# Patient Record
Sex: Female | Born: 1990 | Hispanic: No | Smoking: Current every day smoker
Health system: Southern US, Community
[De-identification: ages and names within clinical notes are randomized; demographics above are authoritative.]

## PROBLEM LIST (undated history)

## (undated) DIAGNOSIS — Z789 Other specified health status: Secondary | ICD-10-CM

## (undated) HISTORY — DX: Other specified health status: Z78.9

## (undated) HISTORY — PX: NO PAST SURGERIES: SHX2092

---

## 2015-11-08 LAB — OB RESULTS CONSOLE ABO/RH: RH TYPE: POSITIVE

## 2015-11-08 LAB — OB RESULTS CONSOLE HGB/HCT, BLOOD
HCT: 42 %
HEMOGLOBIN: 14.5 g/dL

## 2015-11-08 LAB — OB RESULTS CONSOLE ANTIBODY SCREEN: ANTIBODY SCREEN: NEGATIVE

## 2015-11-08 LAB — OB RESULTS CONSOLE RPR: RPR: NONREACTIVE

## 2015-11-08 LAB — OB RESULTS CONSOLE PLATELET COUNT: PLATELETS: 348 10*3/uL

## 2015-11-08 LAB — OB RESULTS CONSOLE RUBELLA ANTIBODY, IGM: Rubella: IMMUNE

## 2015-11-08 LAB — OB RESULTS CONSOLE HEPATITIS B SURFACE ANTIGEN: HEP B S AG: NEGATIVE

## 2015-11-08 LAB — OB RESULTS CONSOLE HIV ANTIBODY (ROUTINE TESTING): HIV: NONREACTIVE

## 2016-03-26 ENCOUNTER — Encounter: Payer: Self-pay | Admitting: *Deleted

## 2016-03-27 ENCOUNTER — Encounter: Payer: Self-pay | Admitting: *Deleted

## 2016-03-29 ENCOUNTER — Encounter: Payer: Self-pay | Admitting: Obstetrics and Gynecology

## 2016-03-29 ENCOUNTER — Ambulatory Visit (INDEPENDENT_AMBULATORY_CARE_PROVIDER_SITE_OTHER): Payer: Self-pay | Admitting: Obstetrics and Gynecology

## 2016-03-29 VITALS — BP 120/69 | HR 74 | Ht 69.0 in | Wt 146.6 lb

## 2016-03-29 DIAGNOSIS — Z720 Tobacco use: Secondary | ICD-10-CM | POA: Insufficient documentation

## 2016-03-29 DIAGNOSIS — Z3492 Encounter for supervision of normal pregnancy, unspecified, second trimester: Secondary | ICD-10-CM

## 2016-03-29 DIAGNOSIS — O0930 Supervision of pregnancy with insufficient antenatal care, unspecified trimester: Secondary | ICD-10-CM | POA: Insufficient documentation

## 2016-03-29 DIAGNOSIS — Z3482 Encounter for supervision of other normal pregnancy, second trimester: Secondary | ICD-10-CM

## 2016-03-29 DIAGNOSIS — Z23 Encounter for immunization: Secondary | ICD-10-CM

## 2016-03-29 DIAGNOSIS — O0993 Supervision of high risk pregnancy, unspecified, third trimester: Secondary | ICD-10-CM | POA: Insufficient documentation

## 2016-03-29 DIAGNOSIS — O0933 Supervision of pregnancy with insufficient antenatal care, third trimester: Secondary | ICD-10-CM

## 2016-03-29 LAB — CBC
HEMATOCRIT: 31.1 % — AB (ref 35.0–45.0)
HEMOGLOBIN: 10.8 g/dL — AB (ref 11.7–15.5)
MCH: 33 pg (ref 27.0–33.0)
MCHC: 34.7 g/dL (ref 32.0–36.0)
MCV: 95.1 fL (ref 80.0–100.0)
MPV: 9.6 fL (ref 7.5–12.5)
Platelets: 294 10*3/uL (ref 140–400)
RBC: 3.27 MIL/uL — ABNORMAL LOW (ref 3.80–5.10)
RDW: 12.7 % (ref 11.0–15.0)
WBC: 11.1 10*3/uL — AB (ref 3.8–10.8)

## 2016-03-29 LAB — POCT URINALYSIS DIP (DEVICE)
Bilirubin Urine: NEGATIVE
GLUCOSE, UA: NEGATIVE mg/dL
Hgb urine dipstick: NEGATIVE
KETONES UR: NEGATIVE mg/dL
Nitrite: NEGATIVE
PROTEIN: NEGATIVE mg/dL
SPECIFIC GRAVITY, URINE: 1.02 (ref 1.005–1.030)
Urobilinogen, UA: 1 mg/dL (ref 0.0–1.0)
pH: 7 (ref 5.0–8.0)

## 2016-03-29 MED ORDER — TETANUS-DIPHTH-ACELL PERTUSSIS 5-2.5-18.5 LF-MCG/0.5 IM SUSP
0.5000 mL | Freq: Once | INTRAMUSCULAR | Status: AC
  Administered 2016-03-29: 0.5 mL via INTRAMUSCULAR

## 2016-03-29 NOTE — Progress Notes (Addendum)
New OB Note  03/29/2016   Clinic: Center for [facility]  Chief Complaint: New OB  Transfer of Care Patient: Yes from [place]  History of Present Illness: Ms. Mannen is a 25 y.o. G3P1011 @ 27/3 weeks ([place] 10/30, based on Patient's last menstrual period was 09/19/2015 (exact date).), with the above CC. Preg complicated by has Supervision of normal pregnancy on her problem list. and tobacco abuse  She only had one visit with [facility] in early pregnancy but was dropped due to insurance issues.   Her periods were: regular, qmonth She was using no method when she conceived.  She has Negative signs or symptoms of miscarriage or preterm labor  ROS: A 12-point review of systems was performed and negative, except as stated in the above HPI.  OBGYN History: As per HPI. OB History  Gravida Para Term Preterm AB Living  3 1 1   1 1   SAB TAB Ectopic Multiple Live Births  1       1    # Outcome Date GA Lbr Len/2nd Weight Sex Delivery Anes PTL Lv  3 Current           2 SAB 10/26/14 [redacted]w[redacted]d         1 Term 09/23/09 [redacted]w[redacted]d  7 lb 11 oz (3.487 kg) M Vag-Spont EPI N LIV     Complications: Meconium aspiration      Any prior children are healthy, doing well, without any problems or issues: yes History of pap smears: No. Last pap smear 2016 and normal.  History of STIs: No   Past Medical History: Past Medical History:  Diagnosis Date  . Medical history non-contributory     Past Surgical History: Past Surgical History:  Procedure Laterality Date  . NO PAST SURGERIES      Family History:  Family History  Problem Relation Age of Onset  . Cancer Mother 47    breast   She denies any history of mental retardation, birth defects or genetic disorders in her or the FOB's history  Social History:  Social History   Social History  . Marital status: Unknown    Spouse name: N/A  . Number of children: N/A  . Years of education: N/A   Occupational History  . Not on file.   Social History  Main Topics  . Smoking status: Current Every Day Smoker    Packs/day: 0.50    Years: 7.00    Types: Cigarettes  . Smokeless tobacco: Never Used  . Alcohol use No  . Drug use: No  . Sexual activity: Yes    Birth control/ protection: None   Other Topics Concern  . Not on file   Social History Narrative  . 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 Fundal height: 24 FHTs: 130s-140s  General appearance: Well nourished, well developed female in no acute distress.  Neck:  Supple, normal appearance, and no thyromegaly  Cardiovascular: S1, S2 normal, no murmur, rub or gallop, regular rate and rhythm Respiratory:  Clear to auscultation bilateral. Normal respiratory effort Abdomen: positive bowel sounds and no masses,  hernias; diffusely non tender to palpation, non distended Breasts: not examined. Neuro/Psych:  Normal mood and affect.  Skin:  Warm and dry.   Pelvic exam: deferred  Laboratory: O pos/RI/VDRL neg/hiv neg/hepB neg/ucx neg/gc-ct and cbc neg  Imaging:  Patient states she has not had any imaging this pregnancy and their is none in the scanned records  Assessment: Pt stable  Plan: 1. Supervision of normal pregnancy, antepartum, second trimester Routine care. tdap today. D/w pt that [place] is on the small side which could be due to dates being off but [place] precautions given and ASAP anatomy/growth/dating u/s scheduled. Tobacco cessation encourage (pt down to 3 per day) and risks associated with use d/w pt.  - HIV antibody (with reflex) - RPR - CBC - Glucose Tolerance, 1 HR (50g) - [place] MFM OB COMP + 14 WK; Future  Problem list reviewed and  updated.  Follow up in 2 weeks.  >50% of 20 min visit spent on counseling and coordination of care.     Durene Romans MD Attending Center for [facility])

## 2016-03-29 NOTE — Progress Notes (Signed)
1hr gtt/28week labs

## 2016-03-30 LAB — HIV ANTIBODY (ROUTINE TESTING W REFLEX): HIV 1&2 Ab, 4th Generation: NONREACTIVE

## 2016-03-30 LAB — RPR

## 2016-03-30 LAB — GLUCOSE TOLERANCE, 1 HOUR (50G) W/O FASTING: Glucose, 1 Hr, gestational: 95 mg/dL (ref <140)

## 2016-04-06 ENCOUNTER — Other Ambulatory Visit: Payer: Self-pay | Admitting: Obstetrics and Gynecology

## 2016-04-06 ENCOUNTER — Ambulatory Visit (HOSPITAL_COMMUNITY)
Admission: RE | Admit: 2016-04-06 | Discharge: 2016-04-06 | Disposition: A | Discharge status: Home / Self Care | Payer: Self-pay | Source: Ambulatory Visit | Attending: Obstetrics and Gynecology | Admitting: Obstetrics and Gynecology

## 2016-04-06 DIAGNOSIS — O99333 Smoking (tobacco) complicating pregnancy, third trimester: Secondary | ICD-10-CM | POA: Insufficient documentation

## 2016-04-06 DIAGNOSIS — Z3482 Encounter for supervision of other normal pregnancy, second trimester: Secondary | ICD-10-CM

## 2016-04-06 DIAGNOSIS — O0933 Supervision of pregnancy with insufficient antenatal care, third trimester: Secondary | ICD-10-CM | POA: Insufficient documentation

## 2016-04-06 DIAGNOSIS — Z36 Encounter for antenatal screening of mother: Secondary | ICD-10-CM | POA: Insufficient documentation

## 2016-04-06 DIAGNOSIS — Z3A28 28 weeks gestation of pregnancy: Secondary | ICD-10-CM | POA: Insufficient documentation

## 2016-04-09 ENCOUNTER — Other Ambulatory Visit (HOSPITAL_COMMUNITY): Payer: Self-pay | Admitting: *Deleted

## 2016-04-09 ENCOUNTER — Encounter: Payer: Self-pay | Admitting: Family Medicine

## 2016-04-09 DIAGNOSIS — O358XX0 Maternal care for other (suspected) fetal abnormality and damage, not applicable or unspecified: Secondary | ICD-10-CM | POA: Insufficient documentation

## 2016-04-10 ENCOUNTER — Encounter (HOSPITAL_COMMUNITY): Payer: Self-pay

## 2016-04-10 ENCOUNTER — Ambulatory Visit (HOSPITAL_COMMUNITY)
Admission: RE | Admit: 2016-04-10 | Discharge: 2016-04-10 | Disposition: A | Discharge status: Home / Self Care | Payer: Self-pay | Source: Ambulatory Visit | Attending: Obstetrics and Gynecology | Admitting: Obstetrics and Gynecology

## 2016-04-10 DIAGNOSIS — O2200 Varicose veins of lower extremity in pregnancy, unspecified trimester: Secondary | ICD-10-CM | POA: Insufficient documentation

## 2016-04-10 DIAGNOSIS — O358XX0 Maternal care for other (suspected) fetal abnormality and damage, not applicable or unspecified: Secondary | ICD-10-CM

## 2016-04-13 ENCOUNTER — Encounter (HOSPITAL_COMMUNITY): Payer: Self-pay

## 2016-04-13 ENCOUNTER — Ambulatory Visit (HOSPITAL_COMMUNITY)
Admission: RE | Admit: 2016-04-13 | Discharge: 2016-04-13 | Disposition: A | Discharge status: Home / Self Care | Payer: Self-pay | Source: Ambulatory Visit | Attending: Obstetrics and Gynecology | Admitting: Obstetrics and Gynecology

## 2016-04-13 DIAGNOSIS — O358XX0 Maternal care for other (suspected) fetal abnormality and damage, not applicable or unspecified: Secondary | ICD-10-CM

## 2016-04-13 DIAGNOSIS — Z3A00 Weeks of gestation of pregnancy not specified: Secondary | ICD-10-CM | POA: Insufficient documentation

## 2016-04-17 ENCOUNTER — Ambulatory Visit (HOSPITAL_COMMUNITY): Payer: Self-pay

## 2016-04-18 ENCOUNTER — Ambulatory Visit (HOSPITAL_COMMUNITY)
Admission: RE | Admit: 2016-04-18 | Payer: Self-pay | Source: Ambulatory Visit | Attending: Maternal and Fetal Medicine | Admitting: Maternal and Fetal Medicine

## 2016-04-20 ENCOUNTER — Ambulatory Visit (HOSPITAL_COMMUNITY)
Admission: RE | Admit: 2016-04-20 | Discharge: 2016-04-20 | Disposition: A | Discharge status: Home / Self Care | Payer: Self-pay | Source: Ambulatory Visit | Attending: Obstetrics and Gynecology | Admitting: Obstetrics and Gynecology

## 2016-04-20 ENCOUNTER — Encounter (HOSPITAL_COMMUNITY): Payer: Self-pay

## 2016-04-20 DIAGNOSIS — O0933 Supervision of pregnancy with insufficient antenatal care, third trimester: Secondary | ICD-10-CM | POA: Insufficient documentation

## 2016-04-20 DIAGNOSIS — Z3A30 30 weeks gestation of pregnancy: Secondary | ICD-10-CM | POA: Insufficient documentation

## 2016-04-20 DIAGNOSIS — O358XX0 Maternal care for other (suspected) fetal abnormality and damage, not applicable or unspecified: Secondary | ICD-10-CM

## 2016-04-20 DIAGNOSIS — O99333 Smoking (tobacco) complicating pregnancy, third trimester: Secondary | ICD-10-CM | POA: Insufficient documentation

## 2016-04-24 ENCOUNTER — Ambulatory Visit (HOSPITAL_COMMUNITY)
Admission: RE | Admit: 2016-04-24 | Discharge: 2016-04-24 | Disposition: A | Discharge status: Home / Self Care | Payer: Self-pay | Source: Ambulatory Visit | Attending: Obstetrics and Gynecology | Admitting: Obstetrics and Gynecology

## 2016-04-24 ENCOUNTER — Encounter (HOSPITAL_COMMUNITY): Payer: Self-pay

## 2016-04-24 DIAGNOSIS — O358XX0 Maternal care for other (suspected) fetal abnormality and damage, not applicable or unspecified: Secondary | ICD-10-CM

## 2016-04-24 DIAGNOSIS — O99333 Smoking (tobacco) complicating pregnancy, third trimester: Secondary | ICD-10-CM | POA: Insufficient documentation

## 2016-04-24 DIAGNOSIS — Z3A31 31 weeks gestation of pregnancy: Secondary | ICD-10-CM | POA: Insufficient documentation

## 2016-04-24 DIAGNOSIS — O0933 Supervision of pregnancy with insufficient antenatal care, third trimester: Secondary | ICD-10-CM | POA: Insufficient documentation

## 2016-04-26 ENCOUNTER — Ambulatory Visit (INDEPENDENT_AMBULATORY_CARE_PROVIDER_SITE_OTHER): Payer: Self-pay | Admitting: Obstetrics and Gynecology

## 2016-04-26 VITALS — BP 106/72 | HR 65 | Wt 146.8 lb

## 2016-04-26 DIAGNOSIS — O0993 Supervision of high risk pregnancy, unspecified, third trimester: Secondary | ICD-10-CM

## 2016-04-26 DIAGNOSIS — O358XX0 Maternal care for other (suspected) fetal abnormality and damage, not applicable or unspecified: Secondary | ICD-10-CM

## 2016-04-26 LAB — POCT URINALYSIS DIP (DEVICE)
BILIRUBIN URINE: NEGATIVE
GLUCOSE, UA: NEGATIVE mg/dL
Hgb urine dipstick: NEGATIVE
KETONES UR: NEGATIVE mg/dL
LEUKOCYTES UA: NEGATIVE
NITRITE: NEGATIVE
PH: 7 (ref 5.0–8.0)
Protein, ur: NEGATIVE mg/dL
Specific Gravity, Urine: 1.02 (ref 1.005–1.030)
Urobilinogen, UA: 0.2 mg/dL (ref 0.0–1.0)

## 2016-04-26 NOTE — Progress Notes (Signed)
Prenatal Visit Note Date: 04/26/2016 Clinic: Center for [facility]  Subjective:  Regina Barnett is a 25 y.o. G3P1011 at [redacted]w[redacted]d being seen today for ongoing prenatal care.  She is currently monitored for the following issues for this high-risk pregnancy and has Supervision of high risk pregnancy in third trimester; Late prenatal care; Tobacco abuse; and Umbilical vein abnormality affecting pregnancy on her problem list.  Patient reports no complaints.    .  .  Movement: Present. Denies leaking of fluid.   The following portions of the patient's history were reviewed and updated as appropriate: allergies, current medications, past family history, past medical history, past social history, past surgical history and problem list. Problem list updated.  Objective:   Vitals:   04/26/16 1113  BP: 106/72  Pulse: 65  Weight: 146 lb 12.8 oz (66.6 kg)    Fetal Status: Fetal Heart Rate (bpm): 154   Movement: Present     General:  Alert, oriented and cooperative. Patient is in no acute distress.  Skin: Skin is warm and dry. No rash noted.   Cardiovascular: Normal heart rate noted  Respiratory: Normal respiratory effort, no problems with respiration noted  Abdomen: Soft, gravid, appropriate for gestational age. Pain/Pressure: Absent     Pelvic:  Cervical exam deferred        Extremities: Normal range of motion.     Mental Status: Normal mood and affect. Normal behavior. Normal judgment and thought content.   Urinalysis:      Assessment and Plan:  Pregnancy: G3P1011 at [redacted]w[redacted]d  1. Supervision of high risk pregnancy in third trimester Routine care. Told to take extra qday iron given slight anemia. Work note given to limit work week to [employer]  2. Umbilical vein abnormality affecting pregnancy Continue with mfm 2x/week bpp. Next one tomorrow. Delivery at 37wks  Preterm labor symptoms and general obstetric precautions including but not limited to vaginal bleeding, contractions, leaking  of fluid and fetal movement were reviewed in detail with the patient. Please refer to After Visit Summary for other counseling recommendations.  Return in about 2 weeks (around 05/10/2016).   Aletha Halim, MD

## 2016-04-27 ENCOUNTER — Ambulatory Visit (HOSPITAL_COMMUNITY)
Admission: RE | Admit: 2016-04-27 | Discharge: 2016-04-27 | Disposition: A | Discharge status: Home / Self Care | Payer: Self-pay | Source: Ambulatory Visit | Attending: Obstetrics and Gynecology | Admitting: Obstetrics and Gynecology

## 2016-04-27 ENCOUNTER — Encounter (HOSPITAL_COMMUNITY): Payer: Self-pay

## 2016-04-27 DIAGNOSIS — O0933 Supervision of pregnancy with insufficient antenatal care, third trimester: Secondary | ICD-10-CM | POA: Insufficient documentation

## 2016-04-27 DIAGNOSIS — O99333 Smoking (tobacco) complicating pregnancy, third trimester: Secondary | ICD-10-CM | POA: Insufficient documentation

## 2016-04-27 DIAGNOSIS — O358XX0 Maternal care for other (suspected) fetal abnormality and damage, not applicable or unspecified: Secondary | ICD-10-CM

## 2016-04-27 DIAGNOSIS — Z3A31 31 weeks gestation of pregnancy: Secondary | ICD-10-CM | POA: Insufficient documentation

## 2016-05-01 ENCOUNTER — Ambulatory Visit (HOSPITAL_COMMUNITY): Admission: RE | Admit: 2016-05-01 | Payer: Self-pay | Source: Ambulatory Visit

## 2016-05-02 ENCOUNTER — Encounter (HOSPITAL_COMMUNITY): Payer: Self-pay

## 2016-05-02 ENCOUNTER — Ambulatory Visit (HOSPITAL_COMMUNITY)
Admission: RE | Admit: 2016-05-02 | Discharge: 2016-05-02 | Disposition: A | Discharge status: Home / Self Care | Payer: Medicaid Other | Source: Ambulatory Visit | Attending: Obstetrics and Gynecology | Admitting: Obstetrics and Gynecology

## 2016-05-02 DIAGNOSIS — Z3A32 32 weeks gestation of pregnancy: Secondary | ICD-10-CM | POA: Diagnosis not present

## 2016-05-02 DIAGNOSIS — Z36 Encounter for antenatal screening of mother: Secondary | ICD-10-CM | POA: Insufficient documentation

## 2016-05-02 DIAGNOSIS — O358XX0 Maternal care for other (suspected) fetal abnormality and damage, not applicable or unspecified: Secondary | ICD-10-CM

## 2016-05-04 ENCOUNTER — Encounter (HOSPITAL_COMMUNITY): Payer: Self-pay

## 2016-05-04 ENCOUNTER — Ambulatory Visit (HOSPITAL_COMMUNITY)
Admission: RE | Admit: 2016-05-04 | Discharge: 2016-05-04 | Disposition: A | Discharge status: Home / Self Care | Payer: Medicaid Other | Source: Ambulatory Visit | Attending: Obstetrics and Gynecology | Admitting: Obstetrics and Gynecology

## 2016-05-04 DIAGNOSIS — O99333 Smoking (tobacco) complicating pregnancy, third trimester: Secondary | ICD-10-CM | POA: Insufficient documentation

## 2016-05-04 DIAGNOSIS — O0933 Supervision of pregnancy with insufficient antenatal care, third trimester: Secondary | ICD-10-CM | POA: Insufficient documentation

## 2016-05-04 DIAGNOSIS — Z3A32 32 weeks gestation of pregnancy: Secondary | ICD-10-CM | POA: Insufficient documentation

## 2016-05-04 DIAGNOSIS — O358XX0 Maternal care for other (suspected) fetal abnormality and damage, not applicable or unspecified: Secondary | ICD-10-CM

## 2016-05-07 ENCOUNTER — Other Ambulatory Visit (HOSPITAL_COMMUNITY): Payer: Self-pay | Admitting: *Deleted

## 2016-05-07 DIAGNOSIS — O283 Abnormal ultrasonic finding on antenatal screening of mother: Secondary | ICD-10-CM

## 2016-05-08 ENCOUNTER — Ambulatory Visit (HOSPITAL_COMMUNITY)
Admission: RE | Admit: 2016-05-08 | Discharge: 2016-05-08 | Disposition: A | Discharge status: Home / Self Care | Payer: Self-pay | Source: Ambulatory Visit | Attending: Obstetrics and Gynecology | Admitting: Obstetrics and Gynecology

## 2016-05-08 ENCOUNTER — Encounter (HOSPITAL_COMMUNITY): Payer: Self-pay

## 2016-05-10 ENCOUNTER — Ambulatory Visit (INDEPENDENT_AMBULATORY_CARE_PROVIDER_SITE_OTHER): Payer: Self-pay | Admitting: Obstetrics and Gynecology

## 2016-05-10 ENCOUNTER — Encounter: Payer: Self-pay | Admitting: Advanced Practice Midwife

## 2016-05-10 VITALS — BP 117/73 | HR 106 | Wt 146.6 lb

## 2016-05-10 DIAGNOSIS — O0993 Supervision of high risk pregnancy, unspecified, third trimester: Secondary | ICD-10-CM

## 2016-05-10 DIAGNOSIS — O358XX0 Maternal care for other (suspected) fetal abnormality and damage, not applicable or unspecified: Secondary | ICD-10-CM

## 2016-05-10 LAB — POCT URINALYSIS DIP (DEVICE)
GLUCOSE, UA: NEGATIVE mg/dL
HGB URINE DIPSTICK: NEGATIVE
KETONES UR: NEGATIVE mg/dL
Nitrite: NEGATIVE
Protein, ur: 30 mg/dL — AB
SPECIFIC GRAVITY, URINE: 1.025 (ref 1.005–1.030)
Urobilinogen, UA: 4 mg/dL — ABNORMAL HIGH (ref 0.0–1.0)
pH: 6.5 (ref 5.0–8.0)

## 2016-05-10 NOTE — Progress Notes (Signed)
Subjective:  Regina Barnett is a 25 y.o. G3P1011 at [redacted]w[redacted]d being seen today for ongoing prenatal care.  She is currently monitored for the following issues for this high-risk pregnancy and has Supervision of high risk pregnancy in third trimester; Late prenatal care; Tobacco abuse; and Umbilical vein abnormality affecting pregnancy on her problem list.  Patient reports no complaints.  Contractions: Irregular. Vag. Bleeding: None.  Movement: Present. Denies leaking of fluid.   The following portions of the patient's history were reviewed and updated as appropriate: allergies, current medications, past family history, past medical history, past social history, past surgical history and problem list. Problem list updated.  Objective:   Vitals:   05/10/16 1425  BP: 117/73  Pulse: (!) 106  Weight: 146 lb 9.6 oz (66.5 kg)    Fetal Status: Fetal Heart Rate (bpm): 152   Movement: Present     General:  Alert, oriented and cooperative. Patient is in no acute distress.  Skin: Skin is warm and dry. No rash noted.   Cardiovascular: Normal heart rate noted  Respiratory: Normal respiratory effort, no problems with respiration noted  Abdomen: Soft, gravid, appropriate for gestational age. Pain/Pressure: Absent     Pelvic:  Cervical exam deferred        Extremities: Normal range of motion.  Edema: None  Mental Status: Normal mood and affect. Normal behavior. Normal judgment and thought content.   Urinalysis: Urine Protein: 1+ Urine Glucose: Negative  Assessment and Plan:  Pregnancy: G3P1011 at [redacted]w[redacted]d  1. Supervision of high risk pregnancy in third trimester 2. Umbilical vein abnormality  Continue with antenatal testing as per MFM and delivery at 37 weeks  Preterm labor symptoms and general obstetric precautions including but not limited to vaginal bleeding, contractions, leaking of fluid and fetal movement were reviewed in detail with the patient. Please refer to After Visit Summary for other  counseling recommendations.  No Follow-up on file.   Chancy Milroy, MD

## 2016-05-10 NOTE — Progress Notes (Signed)
Declines flu shot

## 2016-05-11 ENCOUNTER — Ambulatory Visit (HOSPITAL_COMMUNITY)
Admission: RE | Admit: 2016-05-11 | Discharge: 2016-05-11 | Disposition: A | Discharge status: Home / Self Care | Payer: Medicaid Other | Source: Ambulatory Visit | Attending: Obstetrics and Gynecology | Admitting: Obstetrics and Gynecology

## 2016-05-11 ENCOUNTER — Encounter (HOSPITAL_COMMUNITY): Payer: Self-pay

## 2016-05-11 DIAGNOSIS — O99333 Smoking (tobacco) complicating pregnancy, third trimester: Secondary | ICD-10-CM | POA: Diagnosis not present

## 2016-05-11 DIAGNOSIS — O358XX0 Maternal care for other (suspected) fetal abnormality and damage, not applicable or unspecified: Secondary | ICD-10-CM

## 2016-05-11 DIAGNOSIS — O0933 Supervision of pregnancy with insufficient antenatal care, third trimester: Secondary | ICD-10-CM | POA: Diagnosis not present

## 2016-05-11 DIAGNOSIS — Z3A33 33 weeks gestation of pregnancy: Secondary | ICD-10-CM | POA: Insufficient documentation

## 2016-05-11 DIAGNOSIS — O283 Abnormal ultrasonic finding on antenatal screening of mother: Secondary | ICD-10-CM

## 2016-05-15 ENCOUNTER — Encounter (HOSPITAL_COMMUNITY): Payer: Self-pay

## 2016-05-15 ENCOUNTER — Ambulatory Visit (HOSPITAL_COMMUNITY)
Admission: RE | Admit: 2016-05-15 | Discharge: 2016-05-15 | Disposition: A | Discharge status: Home / Self Care | Payer: Medicaid Other | Source: Ambulatory Visit | Attending: Obstetrics and Gynecology | Admitting: Obstetrics and Gynecology

## 2016-05-15 DIAGNOSIS — Z3A34 34 weeks gestation of pregnancy: Secondary | ICD-10-CM | POA: Insufficient documentation

## 2016-05-15 DIAGNOSIS — O0933 Supervision of pregnancy with insufficient antenatal care, third trimester: Secondary | ICD-10-CM | POA: Insufficient documentation

## 2016-05-15 DIAGNOSIS — O358XX0 Maternal care for other (suspected) fetal abnormality and damage, not applicable or unspecified: Secondary | ICD-10-CM

## 2016-05-15 DIAGNOSIS — O99333 Smoking (tobacco) complicating pregnancy, third trimester: Secondary | ICD-10-CM | POA: Insufficient documentation

## 2016-05-15 DIAGNOSIS — O283 Abnormal ultrasonic finding on antenatal screening of mother: Secondary | ICD-10-CM

## 2016-05-18 ENCOUNTER — Ambulatory Visit (HOSPITAL_COMMUNITY): Admission: RE | Admit: 2016-05-18 | Payer: Self-pay | Source: Ambulatory Visit

## 2016-05-22 ENCOUNTER — Ambulatory Visit (HOSPITAL_COMMUNITY): Admission: RE | Admit: 2016-05-22 | Payer: Self-pay | Source: Ambulatory Visit

## 2016-05-25 ENCOUNTER — Ambulatory Visit (HOSPITAL_COMMUNITY): Admission: RE | Admit: 2016-05-25 | Payer: Self-pay | Source: Ambulatory Visit

## 2016-05-25 ENCOUNTER — Ambulatory Visit (INDEPENDENT_AMBULATORY_CARE_PROVIDER_SITE_OTHER): Payer: Self-pay | Admitting: Obstetrics and Gynecology

## 2016-05-25 VITALS — BP 120/63 | HR 104 | Wt 145.8 lb

## 2016-05-25 DIAGNOSIS — O0993 Supervision of high risk pregnancy, unspecified, third trimester: Secondary | ICD-10-CM

## 2016-05-25 DIAGNOSIS — O358XX0 Maternal care for other (suspected) fetal abnormality and damage, not applicable or unspecified: Secondary | ICD-10-CM

## 2016-05-25 DIAGNOSIS — O0933 Supervision of pregnancy with insufficient antenatal care, third trimester: Secondary | ICD-10-CM

## 2016-05-25 LAB — OB RESULTS CONSOLE GC/CHLAMYDIA: GC PROBE AMP, GENITAL: NEGATIVE

## 2016-05-25 LAB — OB RESULTS CONSOLE GBS: STREP GROUP B AG: POSITIVE

## 2016-05-25 NOTE — Progress Notes (Signed)
   PRENATAL VISIT NOTE  Subjective:  Regina Barnett is a 25 y.o. G3P1011 at [redacted]w[redacted]d being seen today for ongoing prenatal care.  She is currently monitored for the following issues for this high-risk pregnancy and has Supervision of high risk pregnancy in third trimester; Late prenatal care; Tobacco abuse; and Umbilical vein abnormality affecting pregnancy on her problem list.  Patient reports no complaints.  Contractions: Not present. Vag. Bleeding: None.  Movement: Present. Denies leaking of fluid.   The following portions of the patient's history were reviewed and updated as appropriate: allergies, current medications, past family history, past medical history, past social history, past surgical history and problem list. Problem list updated.  Objective:   Vitals:   05/25/16 1100  BP: 120/63  Pulse: (!) 104  Weight: 145 lb 12.8 oz (66.1 kg)    Fetal Status: Fetal Heart Rate (bpm): 135 Fundal Height: 35 cm Movement: Present     General:  Alert, oriented and cooperative. Patient is in no acute distress.  Skin: Skin is warm and dry. No rash noted.   Cardiovascular: Normal heart rate noted  Respiratory: Normal respiratory effort, no problems with respiration noted  Abdomen: Soft, gravid, appropriate for gestational age. Pain/Pressure: Absent     Pelvic:  Cervical exam performed Dilation: 1.5 Effacement (%): 50 Station: Ballotable  Extremities: Normal range of motion.  Edema: None  Mental Status: Normal mood and affect. Normal behavior. Normal judgment and thought content.   Urinalysis:      Assessment and Plan:  Pregnancy: G3P1011 at [redacted]w[redacted]d  1. Supervision of high risk pregnancy in third trimester Patient is doing well without complaints Cultures today Patient is interested in BTL - Culture, beta strep (group b only) - GC/Chlamydia probe amp ([place])not at [facility]  2. Late prenatal care, third trimester   3. Umbilical vein abnormality affecting pregnancy Follow up BPP  today Scheduling IOL at 37 weeks   Preterm labor symptoms and general obstetric precautions including but not limited to vaginal bleeding, contractions, leaking of fluid and fetal movement were reviewed in detail with the patient. Please refer to After Visit Summary for other counseling recommendations.  Return in about 1 week (around 06/01/2016).  Mora Bellman, MD

## 2016-05-26 LAB — CULTURE, BETA STREP (GROUP B ONLY)

## 2016-05-28 LAB — GC/CHLAMYDIA PROBE AMP (~~LOC~~) NOT AT ARMC
CHLAMYDIA, DNA PROBE: NEGATIVE
Neisseria Gonorrhea: NEGATIVE

## 2016-05-29 ENCOUNTER — Telehealth (HOSPITAL_COMMUNITY): Payer: Self-pay | Admitting: *Deleted

## 2016-05-29 ENCOUNTER — Encounter (HOSPITAL_COMMUNITY): Payer: Self-pay

## 2016-05-29 ENCOUNTER — Other Ambulatory Visit (HOSPITAL_COMMUNITY): Payer: Self-pay | Admitting: Maternal and Fetal Medicine

## 2016-05-29 ENCOUNTER — Ambulatory Visit (HOSPITAL_COMMUNITY)
Admission: RE | Admit: 2016-05-29 | Discharge: 2016-05-29 | Disposition: A | Discharge status: Home / Self Care | Payer: Medicaid Other | Source: Ambulatory Visit | Attending: Obstetrics and Gynecology | Admitting: Obstetrics and Gynecology

## 2016-05-29 DIAGNOSIS — O0933 Supervision of pregnancy with insufficient antenatal care, third trimester: Secondary | ICD-10-CM | POA: Insufficient documentation

## 2016-05-29 DIAGNOSIS — O99333 Smoking (tobacco) complicating pregnancy, third trimester: Secondary | ICD-10-CM

## 2016-05-29 DIAGNOSIS — Z3A36 36 weeks gestation of pregnancy: Secondary | ICD-10-CM | POA: Diagnosis not present

## 2016-05-29 DIAGNOSIS — O283 Abnormal ultrasonic finding on antenatal screening of mother: Secondary | ICD-10-CM

## 2016-05-29 NOTE — Telephone Encounter (Signed)
Preadmission screen  

## 2016-06-01 ENCOUNTER — Ambulatory Visit (HOSPITAL_COMMUNITY)
Admission: RE | Admit: 2016-06-01 | Discharge: 2016-06-01 | Disposition: A | Discharge status: Home / Self Care | Payer: Medicaid Other | Source: Ambulatory Visit | Attending: Obstetrics and Gynecology | Admitting: Obstetrics and Gynecology

## 2016-06-01 ENCOUNTER — Ambulatory Visit (INDEPENDENT_AMBULATORY_CARE_PROVIDER_SITE_OTHER): Payer: Self-pay | Admitting: Obstetrics & Gynecology

## 2016-06-01 ENCOUNTER — Encounter (HOSPITAL_COMMUNITY): Payer: Self-pay

## 2016-06-01 ENCOUNTER — Other Ambulatory Visit (HOSPITAL_COMMUNITY): Payer: Self-pay | Admitting: Maternal and Fetal Medicine

## 2016-06-01 VITALS — BP 121/80 | HR 100 | Wt 146.3 lb

## 2016-06-01 DIAGNOSIS — O99333 Smoking (tobacco) complicating pregnancy, third trimester: Secondary | ICD-10-CM | POA: Diagnosis not present

## 2016-06-01 DIAGNOSIS — O321XX0 Maternal care for breech presentation, not applicable or unspecified: Secondary | ICD-10-CM

## 2016-06-01 DIAGNOSIS — Z3A36 36 weeks gestation of pregnancy: Secondary | ICD-10-CM | POA: Insufficient documentation

## 2016-06-01 DIAGNOSIS — O358XX0 Maternal care for other (suspected) fetal abnormality and damage, not applicable or unspecified: Secondary | ICD-10-CM

## 2016-06-01 DIAGNOSIS — O0993 Supervision of high risk pregnancy, unspecified, third trimester: Secondary | ICD-10-CM

## 2016-06-01 DIAGNOSIS — O0933 Supervision of pregnancy with insufficient antenatal care, third trimester: Secondary | ICD-10-CM | POA: Insufficient documentation

## 2016-06-01 DIAGNOSIS — O283 Abnormal ultrasonic finding on antenatal screening of mother: Secondary | ICD-10-CM

## 2016-06-01 NOTE — Progress Notes (Signed)
   PRENATAL VISIT NOTE  Subjective:  Regina Barnett is a 25 y.o. G3P1011 at [redacted]w[redacted]d being seen today for ongoing prenatal care.  She is currently monitored for the following issues for this high-risk pregnancy and has Supervision of high risk pregnancy in third trimester; Late prenatal care; Tobacco abuse; Umbilical vein abnormality affecting pregnancy; and Breech presentation on her problem list.  Patient reports no complaints.  Contractions: Not present. Vag. Bleeding: None.  Movement: Present. Denies leaking of fluid.   The following portions of the patient's history were reviewed and updated as appropriate: allergies, current medications, past family history, past medical history, past social history, past surgical history and problem list. Problem list updated.  Objective:   Vitals:   06/01/16 0938  BP: 121/80  Pulse: 100  Weight: 146 lb 4.8 oz (66.4 kg)    Fetal Status: Fetal Heart Rate (bpm): 140   Movement: Present     General:  Alert, oriented and cooperative. Patient is in no acute distress.  Skin: Skin is warm and dry. No rash noted.   Cardiovascular: Normal heart rate noted  Respiratory: Normal respiratory effort, no problems with respiration noted  Abdomen: Soft, gravid, appropriate for gestational age. Pain/Pressure: Absent     Pelvic:  Cervical exam deferred        Extremities: Normal range of motion.  Edema: None  Mental Status: Normal mood and affect. Normal behavior. Normal judgment and thought content.   Urinalysis:      Assessment and Plan:  Pregnancy: G3P1011 at [redacted]w[redacted]d, breech  By [place]  1. Umbilical vein abnormality affecting pregnancy, single or unspecified fetus IOL 37 weeks is scheduled  2. Supervision of high risk pregnancy in third trimester [place] scheduled today for growth  3. Breech presentation, single or unspecified fetus If breech in 3 days is a candidate for ECV  Preterm labor symptoms and general obstetric precautions including but not  limited to vaginal bleeding, contractions, leaking of fluid and fetal movement were reviewed in detail with the patient. Please refer to After Visit Summary for other counseling recommendations.  Return if symptoms worsen or fail to improve, for 6 weeks PP.  Woodroe Mode, MD

## 2016-06-01 NOTE — Progress Notes (Signed)
Pt scheduled for induction on Monday. Concerned that baby is still breach.

## 2016-06-04 ENCOUNTER — Encounter (HOSPITAL_COMMUNITY)
Admission: RE | Disposition: A | Discharge status: Home / Self Care | Payer: Self-pay | Source: Ambulatory Visit | Attending: Obstetrics and Gynecology

## 2016-06-04 ENCOUNTER — Encounter (HOSPITAL_COMMUNITY): Payer: Self-pay

## 2016-06-04 ENCOUNTER — Inpatient Hospital Stay (HOSPITAL_COMMUNITY)
Admission: RE | Admit: 2016-06-04 | Discharge: 2016-06-06 | DRG: 766 | Disposition: A | Discharge status: Home / Self Care | Payer: Medicaid Other | Source: Ambulatory Visit | Attending: Obstetrics and Gynecology | Admitting: Obstetrics and Gynecology

## 2016-06-04 ENCOUNTER — Inpatient Hospital Stay (HOSPITAL_COMMUNITY): Payer: Medicaid Other | Admitting: Anesthesiology

## 2016-06-04 DIAGNOSIS — O358XX0 Maternal care for other (suspected) fetal abnormality and damage, not applicable or unspecified: Secondary | ICD-10-CM

## 2016-06-04 DIAGNOSIS — O99334 Smoking (tobacco) complicating childbirth: Secondary | ICD-10-CM | POA: Diagnosis present

## 2016-06-04 DIAGNOSIS — O321XX0 Maternal care for breech presentation, not applicable or unspecified: Secondary | ICD-10-CM | POA: Diagnosis not present

## 2016-06-04 DIAGNOSIS — Z3A37 37 weeks gestation of pregnancy: Secondary | ICD-10-CM

## 2016-06-04 DIAGNOSIS — F17210 Nicotine dependence, cigarettes, uncomplicated: Secondary | ICD-10-CM | POA: Diagnosis not present

## 2016-06-04 DIAGNOSIS — O99824 Streptococcus B carrier state complicating childbirth: Secondary | ICD-10-CM | POA: Diagnosis not present

## 2016-06-04 LAB — CBC
HEMATOCRIT: 36.5 % (ref 36.0–46.0)
HEMOGLOBIN: 13 g/dL (ref 12.0–15.0)
MCH: 33.5 pg (ref 26.0–34.0)
MCHC: 35.6 g/dL (ref 30.0–36.0)
MCV: 94.1 fL (ref 78.0–100.0)
Platelets: 304 10*3/uL (ref 150–400)
RBC: 3.88 MIL/uL (ref 3.87–5.11)
RDW: 12.6 % (ref 11.5–15.5)
WBC: 11.8 10*3/uL — AB (ref 4.0–10.5)

## 2016-06-04 LAB — TYPE AND SCREEN
ABO/RH(D): O POS
Antibody Screen: NEGATIVE

## 2016-06-04 LAB — ABO/RH: ABO/RH(D): O POS

## 2016-06-04 LAB — RPR: RPR Ser Ql: NONREACTIVE

## 2016-06-04 SURGERY — Surgical Case
Anesthesia: Spinal | Site: Abdomen | Wound class: Clean Contaminated

## 2016-06-04 MED ORDER — MENTHOL 3 MG MT LOZG: 1.0000 | LOZENGE | OROMUCOSAL | Status: DC | PRN

## 2016-06-04 MED ORDER — BUPIVACAINE IN DEXTROSE 0.75-8.25 % IT SOLN
INTRATHECAL | Status: DC | PRN
  Administered 2016-06-04: 1.6 mg via INTRATHECAL

## 2016-06-04 MED ORDER — MORPHINE SULFATE-NACL 0.5-0.9 MG/ML-% IV SOSY
PREFILLED_SYRINGE | INTRAVENOUS | Status: AC
  Filled 2016-06-04: qty 1

## 2016-06-04 MED ORDER — NALOXONE HCL 0.4 MG/ML IJ SOLN: 0.4000 mg | INTRAMUSCULAR | Status: DC | PRN

## 2016-06-04 MED ORDER — OXYCODONE HCL 5 MG PO TABS
5.0000 mg | ORAL_TABLET | ORAL | Status: DC | PRN
  Administered 2016-06-05 (×2): 5 mg via ORAL
  Filled 2016-06-04 (×2): qty 1

## 2016-06-04 MED ORDER — TETANUS-DIPHTH-ACELL PERTUSSIS 5-2.5-18.5 LF-MCG/0.5 IM SUSP: 0.5000 mL | Freq: Once | INTRAMUSCULAR | Status: DC

## 2016-06-04 MED ORDER — NALBUPHINE HCL 10 MG/ML IJ SOLN: 5.0000 mg | INTRAMUSCULAR | Status: DC | PRN

## 2016-06-04 MED ORDER — ACETAMINOPHEN 325 MG PO TABS: 650.0000 mg | ORAL_TABLET | ORAL | Status: DC | PRN

## 2016-06-04 MED ORDER — OXYTOCIN 10 UNIT/ML IJ SOLN
INTRAMUSCULAR | Status: AC
  Filled 2016-06-04: qty 4

## 2016-06-04 MED ORDER — WITCH HAZEL-GLYCERIN EX PADS: 1.0000 "application " | MEDICATED_PAD | CUTANEOUS | Status: DC | PRN

## 2016-06-04 MED ORDER — LACTATED RINGERS IV SOLN
INTRAVENOUS | Status: DC
  Administered 2016-06-04 (×3): via INTRAVENOUS

## 2016-06-04 MED ORDER — SOD CITRATE-CITRIC ACID 500-334 MG/5ML PO SOLN
30.0000 mL | ORAL | Status: DC | PRN
  Filled 2016-06-04: qty 15

## 2016-06-04 MED ORDER — NALBUPHINE HCL 10 MG/ML IJ SOLN: 5.0000 mg | Freq: Once | INTRAMUSCULAR | Status: DC | PRN

## 2016-06-04 MED ORDER — SCOPOLAMINE 1 MG/3DAYS TD PT72
MEDICATED_PATCH | TRANSDERMAL | Status: DC | PRN
  Administered 2016-06-04: 1 via TRANSDERMAL

## 2016-06-04 MED ORDER — NALOXONE HCL 2 MG/2ML IJ SOSY
1.0000 ug/kg/h | PREFILLED_SYRINGE | INTRAVENOUS | Status: DC | PRN
  Filled 2016-06-04: qty 2

## 2016-06-04 MED ORDER — DIPHENHYDRAMINE HCL 25 MG PO CAPS
25.0000 mg | ORAL_CAPSULE | ORAL | Status: DC | PRN
  Filled 2016-06-04: qty 1

## 2016-06-04 MED ORDER — KETOROLAC TROMETHAMINE 30 MG/ML IJ SOLN
30.0000 mg | Freq: Four times a day (QID) | INTRAMUSCULAR | Status: AC | PRN
  Administered 2016-06-04 – 2016-06-05 (×3): 30 mg via INTRAVENOUS
  Filled 2016-06-04 (×3): qty 1

## 2016-06-04 MED ORDER — LACTATED RINGERS IV SOLN
INTRAVENOUS | Status: DC
  Administered 2016-06-04 – 2016-06-05 (×3): via INTRAVENOUS

## 2016-06-04 MED ORDER — PHENYLEPHRINE 40 MCG/ML (10ML) SYRINGE FOR IV PUSH (FOR BLOOD PRESSURE SUPPORT)
PREFILLED_SYRINGE | INTRAVENOUS | Status: DC | PRN
Start: 2016-06-04 — End: 2016-06-04
  Administered 2016-06-04: 80 ug via INTRAVENOUS

## 2016-06-04 MED ORDER — OXYCODONE-ACETAMINOPHEN 5-325 MG PO TABS: 2.0000 | ORAL_TABLET | ORAL | Status: DC | PRN

## 2016-06-04 MED ORDER — TERBUTALINE SULFATE 1 MG/ML IJ SOLN
INTRAMUSCULAR | Status: AC
  Filled 2016-06-04: qty 1

## 2016-06-04 MED ORDER — LACTATED RINGERS IV SOLN: 500.0000 mL | INTRAVENOUS | Status: DC | PRN

## 2016-06-04 MED ORDER — BUPIVACAINE HCL (PF) 0.5 % IJ SOLN
INTRAMUSCULAR | Status: AC
  Filled 2016-06-04: qty 30

## 2016-06-04 MED ORDER — FENTANYL CITRATE (PF) 100 MCG/2ML IJ SOLN
INTRAMUSCULAR | Status: DC | PRN
  Administered 2016-06-04: 10 ug via INTRAVENOUS

## 2016-06-04 MED ORDER — ONDANSETRON HCL 4 MG/2ML IJ SOLN: 4.0000 mg | Freq: Once | INTRAMUSCULAR | Status: DC | PRN

## 2016-06-04 MED ORDER — SODIUM CHLORIDE 0.9 % IR SOLN
Status: DC | PRN
  Administered 2016-06-04: 1000 mL

## 2016-06-04 MED ORDER — DIPHENHYDRAMINE HCL 25 MG PO CAPS: 25.0000 mg | ORAL_CAPSULE | Freq: Four times a day (QID) | ORAL | Status: DC | PRN

## 2016-06-04 MED ORDER — FENTANYL CITRATE (PF) 100 MCG/2ML IJ SOLN
INTRAMUSCULAR | Status: AC
  Administered 2016-06-04: 25 ug via INTRAVENOUS
  Filled 2016-06-04: qty 2

## 2016-06-04 MED ORDER — ZOLPIDEM TARTRATE 5 MG PO TABS: 5.0000 mg | ORAL_TABLET | Freq: Every evening | ORAL | Status: DC | PRN

## 2016-06-04 MED ORDER — SIMETHICONE 80 MG PO CHEW
80.0000 mg | CHEWABLE_TABLET | ORAL | Status: DC
  Administered 2016-06-05 – 2016-06-06 (×2): 80 mg via ORAL
  Filled 2016-06-04 (×2): qty 1

## 2016-06-04 MED ORDER — OXYCODONE HCL 5 MG PO TABS
10.0000 mg | ORAL_TABLET | ORAL | Status: DC | PRN
  Administered 2016-06-05 – 2016-06-06 (×5): 10 mg via ORAL
  Filled 2016-06-04 (×5): qty 2

## 2016-06-04 MED ORDER — OXYTOCIN 10 UNIT/ML IJ SOLN
INTRAMUSCULAR | Status: DC | PRN
  Administered 2016-06-04: 40 [IU] via INTRAVENOUS

## 2016-06-04 MED ORDER — BUPIVACAINE HCL (PF) 0.5 % IJ SOLN
INTRAMUSCULAR | Status: DC | PRN
  Administered 2016-06-04: 30 mL

## 2016-06-04 MED ORDER — MEPERIDINE HCL 25 MG/ML IJ SOLN: 6.2500 mg | INTRAMUSCULAR | Status: DC | PRN

## 2016-06-04 MED ORDER — PHENYLEPHRINE 8 MG IN D5W 100 ML (0.08MG/ML) PREMIX OPTIME
INJECTION | INTRAVENOUS | Status: AC
  Filled 2016-06-04: qty 100

## 2016-06-04 MED ORDER — OXYCODONE-ACETAMINOPHEN 5-325 MG PO TABS: 1.0000 | ORAL_TABLET | ORAL | Status: DC | PRN

## 2016-06-04 MED ORDER — FENTANYL CITRATE (PF) 100 MCG/2ML IJ SOLN
INTRAMUSCULAR | Status: AC
  Filled 2016-06-04: qty 2

## 2016-06-04 MED ORDER — ONDANSETRON HCL 4 MG/2ML IJ SOLN
INTRAMUSCULAR | Status: AC
  Filled 2016-06-04: qty 2

## 2016-06-04 MED ORDER — LACTATED RINGERS IV SOLN
INTRAVENOUS | Status: DC | PRN
  Administered 2016-06-04: 11:00:00 via INTRAVENOUS

## 2016-06-04 MED ORDER — KETOROLAC TROMETHAMINE 30 MG/ML IJ SOLN
30.0000 mg | Freq: Four times a day (QID) | INTRAMUSCULAR | Status: AC | PRN
  Administered 2016-06-04: 30 mg via INTRAMUSCULAR

## 2016-06-04 MED ORDER — SCOPOLAMINE 1 MG/3DAYS TD PT72
MEDICATED_PATCH | TRANSDERMAL | Status: AC
  Filled 2016-06-04: qty 1

## 2016-06-04 MED ORDER — DIPHENHYDRAMINE HCL 50 MG/ML IJ SOLN: 12.5000 mg | INTRAMUSCULAR | Status: DC | PRN

## 2016-06-04 MED ORDER — SODIUM CHLORIDE 0.9% FLUSH: 3.0000 mL | INTRAVENOUS | Status: DC | PRN

## 2016-06-04 MED ORDER — ONDANSETRON HCL 4 MG/2ML IJ SOLN: 4.0000 mg | Freq: Three times a day (TID) | INTRAMUSCULAR | Status: DC | PRN

## 2016-06-04 MED ORDER — SIMETHICONE 80 MG PO CHEW
80.0000 mg | CHEWABLE_TABLET | Freq: Three times a day (TID) | ORAL | Status: DC
  Administered 2016-06-04 – 2016-06-06 (×5): 80 mg via ORAL
  Filled 2016-06-04 (×5): qty 1

## 2016-06-04 MED ORDER — ACETAMINOPHEN 500 MG PO TABS
1000.0000 mg | ORAL_TABLET | Freq: Four times a day (QID) | ORAL | Status: AC
  Administered 2016-06-04 – 2016-06-05 (×4): 1000 mg via ORAL
  Filled 2016-06-04 (×4): qty 2

## 2016-06-04 MED ORDER — MORPHINE SULFATE-NACL 0.5-0.9 MG/ML-% IV SOSY
PREFILLED_SYRINGE | INTRAVENOUS | Status: DC | PRN
  Administered 2016-06-04: .2 mg via EPIDURAL

## 2016-06-04 MED ORDER — OXYTOCIN 40 UNITS IN LACTATED RINGERS INFUSION - SIMPLE MED: 2.5000 [IU]/h | INTRAVENOUS | Status: DC

## 2016-06-04 MED ORDER — OXYTOCIN 40 UNITS IN LACTATED RINGERS INFUSION - SIMPLE MED: 2.5000 [IU]/h | INTRAVENOUS | Status: AC

## 2016-06-04 MED ORDER — SCOPOLAMINE 1 MG/3DAYS TD PT72: 1.0000 | MEDICATED_PATCH | Freq: Once | TRANSDERMAL | Status: DC

## 2016-06-04 MED ORDER — COCONUT OIL OIL: 1.0000 "application " | TOPICAL_OIL | Status: DC | PRN

## 2016-06-04 MED ORDER — DIBUCAINE 1 % RE OINT: 1.0000 "application " | TOPICAL_OINTMENT | RECTAL | Status: DC | PRN

## 2016-06-04 MED ORDER — FENTANYL CITRATE (PF) 100 MCG/2ML IJ SOLN
25.0000 ug | INTRAMUSCULAR | Status: DC | PRN
  Administered 2016-06-04: 50 ug via INTRAVENOUS
  Administered 2016-06-04: 25 ug via INTRAVENOUS

## 2016-06-04 MED ORDER — PHENYLEPHRINE 8 MG IN D5W 100 ML (0.08MG/ML) PREMIX OPTIME
INJECTION | INTRAVENOUS | Status: DC | PRN
  Administered 2016-06-04: 60 ug/min via INTRAVENOUS

## 2016-06-04 MED ORDER — OXYTOCIN BOLUS FROM INFUSION: 500.0000 mL | Freq: Once | INTRAVENOUS | Status: DC

## 2016-06-04 MED ORDER — KETOROLAC TROMETHAMINE 30 MG/ML IJ SOLN
INTRAMUSCULAR | Status: AC
  Administered 2016-06-04: 30 mg via INTRAVENOUS
  Filled 2016-06-04: qty 1

## 2016-06-04 MED ORDER — IBUPROFEN 600 MG PO TABS
600.0000 mg | ORAL_TABLET | Freq: Four times a day (QID) | ORAL | Status: DC
  Administered 2016-06-05 – 2016-06-06 (×5): 600 mg via ORAL
  Filled 2016-06-04 (×5): qty 1

## 2016-06-04 MED ORDER — PHENYLEPHRINE 40 MCG/ML (10ML) SYRINGE FOR IV PUSH (FOR BLOOD PRESSURE SUPPORT)
PREFILLED_SYRINGE | INTRAVENOUS | Status: AC
  Filled 2016-06-04: qty 10

## 2016-06-04 MED ORDER — SENNOSIDES-DOCUSATE SODIUM 8.6-50 MG PO TABS
2.0000 | ORAL_TABLET | ORAL | Status: DC
  Administered 2016-06-05: 2 via ORAL
  Filled 2016-06-04 (×2): qty 2

## 2016-06-04 MED ORDER — SIMETHICONE 80 MG PO CHEW: 80.0000 mg | CHEWABLE_TABLET | ORAL | Status: DC | PRN

## 2016-06-04 MED ORDER — LACTATED RINGERS IV SOLN: INTRAVENOUS | Status: DC

## 2016-06-04 MED ORDER — PRENATAL MULTIVITAMIN CH
1.0000 | ORAL_TABLET | Freq: Every day | ORAL | Status: DC
  Administered 2016-06-05 – 2016-06-06 (×2): 1 via ORAL
  Filled 2016-06-04 (×2): qty 1

## 2016-06-04 MED ORDER — SOD CITRATE-CITRIC ACID 500-334 MG/5ML PO SOLN: 30.0000 mL | ORAL | Status: DC

## 2016-06-04 MED ORDER — ACETAMINOPHEN 325 MG PO TABS
650.0000 mg | ORAL_TABLET | ORAL | Status: DC | PRN
  Administered 2016-06-05 – 2016-06-06 (×3): 650 mg via ORAL
  Filled 2016-06-04 (×3): qty 2

## 2016-06-04 MED ORDER — ONDANSETRON HCL 4 MG/2ML IJ SOLN
4.0000 mg | Freq: Four times a day (QID) | INTRAMUSCULAR | Status: DC | PRN
  Administered 2016-06-04: 4 mg via INTRAVENOUS

## 2016-06-04 MED ORDER — LIDOCAINE HCL (PF) 1 % IJ SOLN: 30.0000 mL | INTRAMUSCULAR | Status: DC | PRN

## 2016-06-04 MED ORDER — CEFAZOLIN SODIUM-DEXTROSE 2-4 GM/100ML-% IV SOLN
2.0000 g | INTRAVENOUS | Status: AC
  Administered 2016-06-04: 2 g via INTRAVENOUS
  Filled 2016-06-04: qty 100

## 2016-06-04 SURGICAL SUPPLY — 40 items
BENZOIN TINCTURE PRP APPL 2/3 (GAUZE/BANDAGES/DRESSINGS) ×3 IMPLANT
CHLORAPREP W/TINT 26ML (MISCELLANEOUS) ×3 IMPLANT
CLAMP CORD UMBIL (MISCELLANEOUS) IMPLANT
CLOSURE WOUND 1/2 X4 (GAUZE/BANDAGES/DRESSINGS) ×1
CLOTH BEACON ORANGE TIMEOUT ST (SAFETY) ×3 IMPLANT
DRAPE C SECTION CLR SCREEN (DRAPES) ×3 IMPLANT
DRSG OPSITE POSTOP 4X10 (GAUZE/BANDAGES/DRESSINGS) ×3 IMPLANT
ELECT REM PT RETURN 9FT ADLT (ELECTROSURGICAL) ×3
ELECTRODE REM PT RTRN 9FT ADLT (ELECTROSURGICAL) ×1 IMPLANT
EXTRACTOR VACUUM M CUP 4 TUBE (SUCTIONS) IMPLANT
EXTRACTOR VACUUM M CUP 4' TUBE (SUCTIONS)
GLOVE BIO SURGEON STRL SZ7.5 (GLOVE) ×3 IMPLANT
GLOVE BIOGEL PI IND STRL 7.0 (GLOVE) ×1 IMPLANT
GLOVE BIOGEL PI INDICATOR 7.0 (GLOVE) ×2
GOWN STRL REUS W/TWL 2XL LVL3 (GOWN DISPOSABLE) ×3 IMPLANT
GOWN STRL REUS W/TWL LRG LVL3 (GOWN DISPOSABLE) ×6 IMPLANT
KIT ABG SYR 3ML LUER SLIP (SYRINGE) IMPLANT
NEEDLE HYPO 22GX1.5 SAFETY (NEEDLE) ×3 IMPLANT
NEEDLE HYPO 25X5/8 SAFETYGLIDE (NEEDLE) IMPLANT
NS IRRIG 1000ML POUR BTL (IV SOLUTION) ×3 IMPLANT
PACK C SECTION WH (CUSTOM PROCEDURE TRAY) ×3 IMPLANT
PAD OB MATERNITY 4.3X12.25 (PERSONAL CARE ITEMS) ×3 IMPLANT
PENCIL SMOKE EVAC W/HOLSTER (ELECTROSURGICAL) ×3 IMPLANT
RTRCTR C-SECT PINK 25CM LRG (MISCELLANEOUS) ×3 IMPLANT
STRIP CLOSURE SKIN 1/2X4 (GAUZE/BANDAGES/DRESSINGS) ×2 IMPLANT
SUT CHROMIC 1 CTX 36 (SUTURE) ×6 IMPLANT
SUT VIC AB 1 CT1 27 (SUTURE) ×4
SUT VIC AB 1 CT1 27XBRD ANTBC (SUTURE) ×2 IMPLANT
SUT VIC AB 2-0 CT1 (SUTURE) ×3 IMPLANT
SUT VIC AB 2-0 CT1 27 (SUTURE) ×2
SUT VIC AB 2-0 CT1 TAPERPNT 27 (SUTURE) ×1 IMPLANT
SUT VIC AB 3-0 CT1 27 (SUTURE) ×4
SUT VIC AB 3-0 CT1 TAPERPNT 27 (SUTURE) ×2 IMPLANT
SUT VIC AB 3-0 SH 27 (SUTURE)
SUT VIC AB 3-0 SH 27X BRD (SUTURE) IMPLANT
SUT VIC AB 4-0 KS 27 (SUTURE) ×3 IMPLANT
SYR BULB IRRIGATION 50ML (SYRINGE) ×3 IMPLANT
SYR CONTROL 10ML LL (SYRINGE) ×3 IMPLANT
TOWEL OR 17X24 6PK STRL BLUE (TOWEL DISPOSABLE) ×3 IMPLANT
TRAY FOLEY CATH SILVER 14FR (SET/KITS/TRAYS/PACK) ×3 IMPLANT

## 2016-06-04 NOTE — Transfer of Care (Signed)
Immediate Anesthesia Transfer of Care Note  Patient: Regina Barnett  Procedure(s) Performed: Procedure(s): CESAREAN SECTION (N/A)  Patient Location: PACU  Anesthesia Type:Spinal  Level of Consciousness: awake  Airway & Oxygen Therapy: Patient Spontanous Breathing  Post-op Assessment: Report given to RN and Post -op Vital signs reviewed and stable  Post vital signs: stable  Last Vitals:  Vitals:   06/04/16 0811 06/04/16 0915  BP: 124/78 124/73  Pulse: 84 78  Resp: 18 18  Temp: 36.7 C     Last Pain:  Vitals:   06/04/16 0915  TempSrc:   PainSc: 0-No pain         Complications: No apparent anesthesia complications

## 2016-06-04 NOTE — Op Note (Signed)
Cesarean Section Procedure Note  06/04/2016  11:49 AM  PATIENT:  Regina Barnett  25 y.o. female  PRE-OPERATIVE DIAGNOSIS:  breech, umbilical vein varix  POST-OPERATIVE DIAGNOSIS:  breech, umbilical vein varix  PROCEDURE:  Procedure(s): CESAREAN SECTION (N/A)  SURGEON:  Surgeon(s) and Role:    * Chancy Milroy, MD - Primary    * Isaias Sakai, DO - Resident - Assisting  ASSISTANTS: Ree Shay Chene Kasinger, DO  ANESTHESIA:   spinal  EBL:  Total I/O In: 3400 [I.V.:3400] Out: 700 [Urine:200; Blood:500]  BLOOD ADMINISTERED:none  DRAINS: none   LOCAL MEDICATIONS USED:  0.5% MARCAINE  Amount: 30 ml subcutaneously at incision site  SPECIMEN:  Source of Specimen:  Placenta  DISPOSITION OF SPECIMEN:  PATHOLOGY   Procedure Details   The patient was seen in the Holding Room. The risks, benefits, complications, treatment options, and expected outcomes were discussed with the patient.  The patient concurred with the proposed plan, giving informed consent.  The site of surgery properly noted/marked. The patient was taken to Operating Room # 9, identified as Rylynne Kauth and the procedure verified as C-Section Delivery. A Time Out was held and the above information confirmed.  After induction of anesthesia, the patient was draped and prepped in the usual sterile manner. A Pfannenstiel incision was made and carried down through the subcutaneous tissue to the fascia. Fascial incision was made and extended transversely. The fascia was separated from the underlying rectus tissue superiorly and inferiorly. The peritoneum was identified and entered. Peritoneal incision was extended longitudinally. The utero-vesical peritoneal reflection was incised transversely and the bladder flap was bluntly freed from the lower uterine segment. A low transverse uterine incision was made. Delivered from breech, frank presentation was a Female with Apgar scores of 8 at one minute and 9 at five minutes.  After the umbilical cord was clamped and cut cord blood was obtained for evaluation. The placenta was removed intact and appeared normal. The uterine outline, tubes and ovaries appeared normal. The uterine incision was closed with running locked sutures of 1-0 Chromic. Imbricating layer was closed with 1-0 Chromic. Bladder flap closed with 2-0 Vicryl. Hemostasis was observed. Lavage was carried out until clear. The peritoneal layer with reapproximation of muscles was performed with 2-0 Vicryl. The fascia was then reapproximated with running sutures of 1-0 Vicryl. Subcutaneous layer was closed with 2-0 Vicryl in an interrupted pattern.  The skin was reapproximated with 4-0 Vicryl.  Instrument, sponge, and needle counts were correct prior the abdominal closure and at the conclusion of the case.   Complications:  None; patient tolerated the procedure well.  COUNTS:  YES  PLAN OF CARE: Admit to inpatient   PATIENT DISPOSITION:  PACU - hemodynamically stable.   Delay start of Pharmacological VTE agent (>24hrs) due to surgical blood loss or risk of bleeding: yes             Disposition: PACU - hemodynamically stable.         Condition: stable   [place], [place] Fellow 06/04/2016 11:49 AM

## 2016-06-04 NOTE — Anesthesia Postprocedure Evaluation (Signed)
Anesthesia Post Note  Patient: Facilities manager  Procedure(s) Performed: Procedure(s) (LRB): CESAREAN SECTION (N/A)  Patient location during evaluation: PACU Anesthesia Type: Spinal Level of consciousness: oriented and awake and alert Pain management: pain level controlled Vital Signs Assessment: post-procedure vital signs reviewed and stable Respiratory status: spontaneous breathing, respiratory function stable and patient connected to nasal cannula oxygen Cardiovascular status: blood pressure returned to baseline and stable Postop Assessment: no headache, no backache, spinal receding and patient able to bend at knees Anesthetic complications: no     Last Vitals:  Vitals:   06/04/16 1321 06/04/16 1415  BP: 110/62 (!) 116/59  Pulse: (!) 52 (!) 55  Resp: 16 16  Temp: 36.4 C 36.3 C    Last Pain:  Vitals:   06/04/16 1415  TempSrc: Oral  PainSc: 5    Pain Goal: Patients Stated Pain Goal: 3 (06/04/16 1415)               Dawn Convery JENNETTE

## 2016-06-04 NOTE — H&P (Signed)
LABOR AND DELIVERY ADMISSION HISTORY AND PHYSICAL NOTE  Regina Barnett is a 25 y.o. female [id number] with IUP at [redacted]w[redacted]d by LMP presenting for IOL for umbilical vein varix. Baby is in breech position by [place].   She reports positive fetal movement. She denies leakage of fluid or vaginal bleeding.  Prenatal History/Complications:  Past Medical History: Past Medical History:  Diagnosis Date  . Medical history non-contributory     Past Surgical History: Past Surgical History:  Procedure Laterality Date  . NO PAST SURGERIES      Obstetrical History: OB History    Gravida Para Term Preterm AB Living   3 2 2   1 2    SAB TAB Ectopic Multiple Live Births   1     0 2      Social History: Social History   Social History  . Marital status: Unknown    Spouse name: N/A  . Number of children: N/A  . Years of education: N/A   Social History Main Topics  . Smoking status: Current Every Day Smoker    Packs/day: 0.50    Years: 7.00    Types: Cigarettes  . Smokeless tobacco: Never Used  . Alcohol use No  . Drug use: No  . Sexual activity: Yes    Birth control/ protection: None   Other Topics Concern  . None   Social History Narrative  . None    Family History: Family History  Problem Relation Age of Onset  . Cancer Mother 36    breast    Allergies: No Known Allergies  Prescriptions Prior to Admission  Medication Sig Dispense Refill Last Dose  . acetaminophen (TYLENOL) 325 MG tablet Take 650 mg by mouth every 6 (six) hours as needed for mild pain, moderate pain or headache.   06/03/2016 at 1900  . Prenatal Vit-Fe Fumarate-FA (PRENATAL MULTIVITAMIN) TABS tablet Take 1 tablet by mouth every evening.   06/03/2016 at Unknown time     Review of Systems   All systems reviewed and negative except as stated in HPI  Blood pressure 124/73, pulse 78, temperature (P) 97.8 F (36.6 C), temperature source (P) Oral, resp. rate 18, height 5\' 9"  (1.753 m), weight 66.7 kg (147 lb),  last menstrual period 09/19/2015, unknown if currently breastfeeding. General appearance: alert, cooperative and no distress Lungs: no respiratory distress Heart: regular rate Abdomen: soft, non-tender Extremities: No calf swelling or tenderness Presentation: breech by ultrasound Fetal monitoring: baseline 145, moderate variability with accelerations, no decelerations Uterine activity: no contractions     Prenatal labs: ABO, Rh: --/--/O POS, O POS (10/09 0820) Antibody: NEG (10/09 0820) Rubella: immune RPR: NON REAC (08/03 1622)  HBsAg: Negative (03/14 0000)  HIV: NONREACTIVE (08/03 1622)  GBS: Positive (09/29 0000)   Prenatal Transfer Tool  Maternal Diabetes: No Genetic Screening: Normal Maternal Ultrasounds/Referrals: Abnormal:  Findings:   Other:umbilical vein varix Fetal Ultrasounds or other Referrals:  Referred to Materal Fetal Medicine  Maternal Substance Abuse:  No Significant Maternal Medications:  None Significant Maternal Lab Results: None  Results for orders placed or performed during the hospital encounter of 06/04/16 (from the past 24 hour(s))  CBC   Collection Time: 06/04/16  8:20 AM  Result Value Ref Range   WBC 11.8 (H) 4.0 - 10.5 K/uL   RBC 3.88 3.87 - 5.11 MIL/uL   Hemoglobin 13.0 12.0 - 15.0 g/dL   HCT 36.5 36.0 - 46.0 %   MCV 94.1 78.0 - 100.0 fL   MCH  33.5 26.0 - 34.0 pg   MCHC 35.6 30.0 - 36.0 g/dL   RDW 12.6 11.5 - 15.5 %   Platelets 304 150 - 400 K/uL  Type and screen [place]   Collection Time: 06/04/16  8:20 AM  Result Value Ref Range   ABO/RH(D) O POS    Antibody Screen NEG    Sample Expiration 06/07/2016   ABO/Rh   Collection Time: 06/04/16  8:20 AM  Result Value Ref Range   ABO/RH(D) O POS     Patient Active Problem List   Diagnosis Date Noted  . Normal labor and delivery 06/04/2016  . Breech presentation 06/01/2016  . Umbilical vein abnormality affecting pregnancy 04/09/2016  . Supervision of high risk  pregnancy in third trimester 03/29/2016  . Late prenatal care 03/29/2016  . Tobacco abuse 03/29/2016    Assessment: Regina Barnett is a 25 y.o. [id number] at [redacted]w[redacted]d here for IOL due to umbilical vein varix, breech presentation  #Labor: c-section planned3 #Pain: anesthesia #FWB: Category 1 tracing #ID:  gbs positive #MOF: bottle #MOC:unsure #Circ:  [place], DO PGY-1 10/9/201711:54 AM    OB FELLOW HISTORY AND PHYSICAL ATTESTATION  I have seen and examined this patient; I agree with above documentation in the resident's note.    Katherine Basset, DO [place] Fellow 06/04/2016

## 2016-06-04 NOTE — Anesthesia Postprocedure Evaluation (Signed)
Anesthesia Post Note  Patient: Regina Barnett  Procedure(s) Performed: Procedure(s) (LRB): CESAREAN SECTION (N/A)  Patient location during evaluation: Mother Baby Anesthesia Type: Epidural Level of consciousness: awake and alert Pain management: pain level controlled Vital Signs Assessment: post-procedure vital signs reviewed and stable Respiratory status: spontaneous breathing, nonlabored ventilation and respiratory function stable Cardiovascular status: stable Postop Assessment: no headache, no backache and epidural receding Anesthetic complications: no     Last Vitals:  Vitals:   06/04/16 1321 06/04/16 1415  BP: 110/62 (!) 116/59  Pulse: (!) 52 (!) 55  Resp: 16 16  Temp: 36.4 C 36.3 C    Last Pain:  Vitals:   06/04/16 1415  TempSrc: Oral  PainSc: 5    Pain Goal: Patients Stated Pain Goal: 3 (06/04/16 1415)               Gilmer Mor

## 2016-06-04 NOTE — Progress Notes (Signed)
OB Attending Pt admitted for IOL due to UV abnormality. However noted to be frank breech. Delivery options reviewed with pt and FOB, including ECV, breech vaginal delivery and c section. R/B and post care of each reviewed. Follow discussion pt has elected to proceed with c section.  The risks of cesarean section discussed with the patient included but were not limited to: bleeding which may require transfusion or reoperation; infection which may require antibiotics; injury to bowel, bladder, ureters or other surrounding organs; injury to the fetus; need for additional procedures including hysterectomy in the event of a life-threatening hemorrhage; placental abnormalities wth subsequent pregnancies, incisional problems, thromboembolic phenomenon and other postoperative/anesthesia complications. The patient concurred with the proposed plan, giving informed written consent for the procedure.   Anesthesia and OR aware. Preoperative prophylactic antibiotics and SCDs ordered on call to the OR.  To OR when ready.  Nisa Decaire L. Rip Harbour, MD

## 2016-06-04 NOTE — Anesthesia Procedure Notes (Signed)
Spinal  Patient location during procedure: OR Staffing Anesthesiologist: Lauretta Grill Performed: anesthesiologist  Preanesthetic Checklist Completed: patient identified, site marked, surgical consent, pre-op evaluation, timeout performed, IV checked, risks and benefits discussed and monitors and equipment checked Spinal Block Patient position: sitting Prep: DuraPrep Patient monitoring: continuous pulse ox, blood pressure and heart rate Approach: midline Location: L3-4 Injection technique: single-shot Needle Needle type: Spinocan  Needle gauge: 24 G Needle length: 9 cm Additional Notes Functioning IV was confirmed and monitors were applied. Sterile prep and drape, including hand hygiene, mask and sterile gloves were used. The patient was positioned and the spine was prepped. The skin was anesthetized with lidocaine.  Free flow of clear CSF was obtained prior to injecting local anesthetic into the CSF.  The spinal needle aspirated freely following injection.  The needle was carefully withdrawn.  The patient tolerated the procedure well. Consent was obtained prior to procedure with all questions answered and concerns addressed. Risks including but not limited to bleeding, infection, nerve damage, paralysis, failed block, inadequate analgesia, allergic reaction, high spinal, itching and headache were discussed and the patient wished to proceed.   Lauretta Grill, MD

## 2016-06-04 NOTE — Addendum Note (Signed)
Addendum  created 06/04/16 1551 by Alvy Bimler, CRNA   Sign clinical note

## 2016-06-04 NOTE — Anesthesia Preprocedure Evaluation (Addendum)
Anesthesia Evaluation  Patient identified by MRN, date of birth, ID band Patient awake    Reviewed: Allergy & Precautions, NPO status , Patient's Chart, lab work & pertinent test results  History of Anesthesia Complications Negative for: history of anesthetic complications  Airway Mallampati: II  TM Distance: >3 FB Neck ROM: Full    Dental no notable dental hx. (+) Dental Advisory Given   Pulmonary Current Smoker,    Pulmonary exam normal breath sounds clear to auscultation       Cardiovascular negative cardio ROS Normal cardiovascular exam Rhythm:Regular Rate:Normal     Neuro/Psych negative neurological ROS  negative psych ROS   GI/Hepatic negative GI ROS, Neg liver ROS,   Endo/Other  negative endocrine ROS  Renal/GU negative Renal ROS  negative genitourinary   Musculoskeletal negative musculoskeletal ROS (+)   Abdominal   Peds negative pediatric ROS (+)  Hematology negative hematology ROS (+)   Anesthesia Other Findings   Reproductive/Obstetrics (+) Pregnancy                             Anesthesia Physical Anesthesia Plan  ASA: II  Anesthesia Plan: Spinal   Post-op Pain Management:    Induction:   Airway Management Planned:   Additional Equipment:   Intra-op Plan:   Post-operative Plan:   Informed Consent: I have reviewed the patients History and Physical, chart, labs and discussed the procedure including the risks, benefits and alternatives for the proposed anesthesia with the patient or authorized representative who has indicated his/her understanding and acceptance.   Dental advisory given  Plan Discussed with: CRNA  Anesthesia Plan Comments:        Anesthesia Quick Evaluation

## 2016-06-05 ENCOUNTER — Ambulatory Visit (HOSPITAL_COMMUNITY): Payer: Self-pay

## 2016-06-05 LAB — CBC
HEMATOCRIT: 30.2 % — AB (ref 36.0–46.0)
Hemoglobin: 10.7 g/dL — ABNORMAL LOW (ref 12.0–15.0)
MCH: 33.4 pg (ref 26.0–34.0)
MCHC: 35.4 g/dL (ref 30.0–36.0)
MCV: 94.4 fL (ref 78.0–100.0)
Platelets: 247 10*3/uL (ref 150–400)
RBC: 3.2 MIL/uL — AB (ref 3.87–5.11)
RDW: 12.7 % (ref 11.5–15.5)
WBC: 14.5 10*3/uL — AB (ref 4.0–10.5)

## 2016-06-05 NOTE — Progress Notes (Signed)
Subjective: Postpartum Day 1: Cesarean Delivery Patient reports incisional pain.  Foley out. Has not voided yet  Objective: Vital signs in last 24 hours: Temp:  [97.4 F (36.3 C)-98.3 F (36.8 C)] 97.8 F (36.6 C) (10/10 0415) Pulse Rate:  [51-84] 56 (10/10 0415) Resp:  [14-20] 18 (10/10 0415) BP: (93-124)/(58-87) 107/63 (10/10 0415) SpO2:  [98 %-100 %] 100 % (10/10 0415) Weight:  [147 lb (66.7 kg)] 147 lb (66.7 kg) (10/09 0825)  Physical Exam:  General: alert, cooperative and no distress Lochia: appropriate Uterine Fundus: firm Incision: healing well, no significant drainage Small area old blood on right DVT Evaluation: No evidence of DVT seen on physical exam.   Recent Labs  06/04/16 0820 06/05/16 0548  HGB 13.0 10.7*  HCT 36.5 30.2*    Assessment/Plan: Status post Cesarean section. Doing well postoperatively.  Continue current care.  Regina Barnett 06/05/2016, 6:29 AM

## 2016-06-05 NOTE — Progress Notes (Signed)
UR chart review completed.  

## 2016-06-06 MED ORDER — OXYCODONE HCL 5 MG PO TABS: 5.0000 mg | ORAL_TABLET | ORAL | 0 refills | Status: DC | PRN

## 2016-06-06 MED ORDER — IBUPROFEN 600 MG PO TABS: 600.0000 mg | ORAL_TABLET | Freq: Four times a day (QID) | ORAL | 0 refills | Status: AC

## 2016-06-06 MED ORDER — SENNOSIDES-DOCUSATE SODIUM 8.6-50 MG PO TABS: 2.0000 | ORAL_TABLET | ORAL | 1 refills | Status: DC

## 2016-06-06 NOTE — Discharge Summary (Signed)
OB Discharge Summary     Patient Name: Regina Barnett DOB: 1990/11/19 MRN: [id number]  Date of admission: 06/04/2016 Delivering MD: Chancy Milroy   Date of discharge: 06/06/2016  Admitting diagnosis: INDUCTION Intrauterine pregnancy: [redacted]w[redacted]d     Secondary diagnosis:  Active Problems:   Normal labor and delivery  Additional problems: umbilical vein varix, breech presentation     Discharge diagnosis: Term Pregnancy Delivered                                                                                                Post partum procedures:none  Augmentation: none  Complications: None  Hospital course:  Sceduled C/S   25 y.o. yo [id number] at [redacted]w[redacted]d was admitted to the hospital 06/04/2016 for scheduled cesarean section with the following indication:Malpresentation.  Membrane Rupture Time/Date: 10:49 AM ,06/04/2016   Patient delivered a Viable infant.06/04/2016  Details of operation can be found in separate operative note.  Pateint had an uncomplicated postpartum course.  She is ambulating, tolerating a regular diet, passing flatus, and urinating well. Patient is discharged home in stable condition on  06/06/16          Physical exam Vitals:   06/05/16 1300 06/05/16 1800 06/05/16 1945 06/06/16 0527  BP: 122/78 116/66 108/68 113/70  Pulse: 70 (!) 57 (!) 58 61  Resp: 16 18 16 17   Temp: 97.8 F (36.6 C) 98.8 F (37.1 C) 98.2 F (36.8 C) 98.4 F (36.9 C)  TempSrc: Oral Oral Oral Oral  SpO2:      Weight:      Height:       General: alert, cooperative and no distress Lochia: appropriate Uterine Fundus: firm Incision: Healing well with no significant drainage DVT Evaluation: No evidence of DVT seen on physical exam. Labs: Lab Results  Component Value Date   WBC 14.5 (H) 06/05/2016   HGB 10.7 (L) 06/05/2016   HCT 30.2 (L) 06/05/2016   MCV 94.4 06/05/2016   PLT 247 06/05/2016   No flowsheet data found.  Discharge instruction: per After Visit Summary and "Baby and Me  Booklet".  After visit meds:    Medication List    TAKE these medications   acetaminophen 325 MG tablet Commonly known as:  TYLENOL Take 650 mg by mouth every 6 (six) hours as needed for mild pain, moderate pain or headache.   ibuprofen 600 MG tablet Commonly known as:  ADVIL,MOTRIN Take 1 tablet (600 mg total) by mouth every 6 (six) hours.   oxyCODONE 5 MG immediate release tablet Commonly known as:  Oxy IR/ROXICODONE Take 1 tablet (5 mg total) by mouth every 4 (four) hours as needed (pain scale 4-7).   prenatal multivitamin Tabs tablet Take 1 tablet by mouth every evening.   senna-docusate 8.6-50 MG tablet Commonly known as:  Senokot-S Take 2 tablets by mouth daily.       Diet: routine diet  Activity: Advance as tolerated. Pelvic rest for 6 weeks.   Outpatient follow up:2 weeks Follow up Appt:Future Appointments Date Time Provider [place]  07/16/2016 10:20 AM Jorje Guild, NP [facility]  Follow up Visit:No Follow-up on file.  Postpartum contraception: Combination OCPs and Undecided  Newborn Data: Live born female  Birth Weight: 5 lb 5.4 oz (2420 g) APGAR: 8, 9  Baby Feeding: Bottle Disposition:home with mother   06/06/2016 Steve Rattler, DO  OB FELLOW DISCHARGE ATTESTATION  I have seen and examined this patient and agree with above documentation in the resident's note.   Jacquiline Doe, MD 12:33 PM

## 2016-06-06 NOTE — Discharge Instructions (Signed)
Cesarean Delivery, Care After  Refer to this sheet in the next few weeks. These instructions provide you with information on caring for yourself after your procedure. Your health care provider may also give you specific instructions. Your treatment has been planned according to current medical practices, but problems sometimes occur. Call your health care provider if you have any problems or questions after you go home.  HOME CARE INSTRUCTIONS   Only take over-the-counter or prescription medications as directed by your health care provider.   Do not drink alcohol, especially if you are breastfeeding or taking medication to relieve pain.   Do not chew or smoke tobacco.   Continue to use good perineal care. Good perineal care includes:    Wiping your perineum from front to back.    Keeping your perineum clean.   Check your surgical cut (incision) daily for increased redness, drainage, swelling, or separation of skin.   Clean your incision gently with soap and water every day, and then pat it dry. If your health care provider says it is okay, leave the incision uncovered. Use a bandage (dressing) if the incision is draining fluid or appears irritated. If the adhesive strips across the incision do not fall off within 7 days, carefully peel them off.   Hug a pillow when coughing or sneezing until your incision is healed. This helps to relieve pain.   Do not use tampons or douche until your health care provider says it is okay.   Shower, wash your hair, and take tub baths as directed by your health care provider.   Wear a well-fitting bra that provides breast support.   Limit wearing support panties or control-top hose.   Drink enough fluids to keep your urine clear or pale yellow.   Eat high-fiber foods such as whole grain cereals and breads, brown rice, beans, and fresh fruits and vegetables every day. These foods may help prevent or relieve constipation.   Resume activities such as climbing stairs,  driving, lifting, exercising, or traveling as directed by your health care provider.   Talk to your health care provider about resuming sexual activities. This is dependent upon your risk of infection, your rate of healing, and your comfort and desire to resume sexual activity.   Try to have someone help you with your household activities and your newborn for at least a few days after you leave the hospital.   Rest as much as possible. Try to rest or take a nap when your newborn is sleeping.   Increase your activities gradually.   Keep all of your scheduled postpartum appointments. It is very important to keep your scheduled follow-up appointments. At these appointments, your health care provider will be checking to make sure that you are healing physically and emotionally.  SEEK MEDICAL CARE IF:    You are passing large clots from your vagina. Save any clots to show your health care provider.   You have a foul smelling discharge from your vagina.   You have trouble urinating.   You are urinating frequently.   You have pain when you urinate.   You have a change in your bowel movements.   You have increasing redness, pain, or swelling near your incision.   You have pus draining from your incision.   Your incision is separating.   You have painful, hard, or reddened breasts.   You have a severe headache.   You have blurred vision or see spots.   You feel sad   or depressed.   You have thoughts of hurting yourself or your newborn.   You have questions about your care, the care of your newborn, or medications.   You are dizzy or light-headed.   You have a rash.   You have pain, redness, or swelling at the site of the removed intravenous access (IV) tube.   You have nausea or vomiting.   You stopped breastfeeding and have not had a menstrual period within 12 weeks of stopping.   You are not breastfeeding and have not had a menstrual period within 12 weeks of delivery.   You have a fever.  SEEK  IMMEDIATE MEDICAL CARE IF:   You have persistent pain.   You have chest pain.   You have shortness of breath.   You faint.   You have leg pain.   You have stomach pain.   Your vaginal bleeding saturates 2 or more sanitary pads in 1 hour.  MAKE SURE YOU:    Understand these instructions.   Will watch your condition.   Will get help right away if you are not doing well or get worse.     This information is not intended to replace advice given to you by your health care provider. Make sure you discuss any questions you have with your health care provider.     Document Released: 05/05/2002 Document Revised: 09/03/2014 Document Reviewed: 04/09/2012  Elsevier Interactive Patient Education 2016 [employer]

## 2016-06-08 ENCOUNTER — Other Ambulatory Visit (HOSPITAL_COMMUNITY): Payer: Self-pay

## 2016-06-13 ENCOUNTER — Telehealth: Payer: Self-pay | Admitting: *Deleted

## 2016-06-13 NOTE — Telephone Encounter (Signed)
Message received from [facility] [employer] requesting information from pt's delivery of baby. She wants to know the date and time of delivery, ICD 10 codes, next office visit date and return to work date. Per chart review, there is no signed ROI not is there a copy of FMLA papers. I returned the call and left a message stating that we can provide the information requested if they have a signed release of information form. If not, they may leave a message on nurse voice mail and we will attempt to contact the pt so she can sign ROI form in our office. The information requested is as follows: Delivery date 06/04/16 by C/S, time unknown; ICD 10 codes O32.1XX0 and O35.8XX0; next office visit scheduled 07/16/16; projected return to work 08/06/16.

## 2016-07-10 ENCOUNTER — Telehealth: Payer: Self-pay

## 2016-07-10 NOTE — Telephone Encounter (Signed)
Opened in error

## 2016-07-10 NOTE — Telephone Encounter (Signed)
Attempted x 2 to contact pt in regards to faxed FMLA paperwork.  Unable to LM due to message stating "is not recognized #".  Note left in appt notes to give pt copy of FMLA paperwork.

## 2016-07-16 ENCOUNTER — Encounter: Payer: Self-pay | Admitting: Obstetrics and Gynecology

## 2016-07-16 ENCOUNTER — Ambulatory Visit: Payer: Self-pay | Admitting: Student

## 2016-07-16 ENCOUNTER — Ambulatory Visit (INDEPENDENT_AMBULATORY_CARE_PROVIDER_SITE_OTHER): Payer: Self-pay | Admitting: Obstetrics and Gynecology

## 2016-07-16 NOTE — Progress Notes (Signed)
Obstetrics Visit Postpartum Visit  Appointment Date: 07/16/2016  OBGYN Clinic: [facility]  Primary Care Provider: No primary care provider on file.  Chief Complaint:  Chief Complaint  Patient presents with  . Postpartum Care    History of Present Illness: Regina Barnett is a 25 y.o. Caucasian [id number] (LMP: 11/18), seen for the above chief complaint. Her past medical history is significant for fetal umbilical varix   She is s/p pLTCS on 10/9 by Dr. Rip Harbour for fetal umbilical cord varix and breech; she was discharged to home on POD#2  Vaginal bleeding or discharge: Yes, when her period started Breast or formula feeding: formula Intercourse: Yes  Contraception after delivery: None PP depression s/s: No  Any bowel or bladder issues: No  Pap smear: no abnormalities (date: 2016)  Review of Systems: Her 12 point review of systems is negative or as noted in the History of Present Illness.  Medications Ms. Swindle had no medications administered during this visit. Current Outpatient Prescriptions  Medication Sig Dispense Refill  . acetaminophen (TYLENOL) 325 MG tablet Take 650 mg by mouth every 6 (six) hours as needed for mild pain, moderate pain or headache.    . ibuprofen (ADVIL,MOTRIN) 600 MG tablet Take 1 tablet (600 mg total) by mouth every 6 (six) hours. (Patient not taking: Reported on 07/16/2016) 30 tablet 0   No current facility-administered medications for this visit.     Allergies Patient has no known allergies.  Physical Exam:  BP 119/79   Pulse 68   Ht 5\' 9"  (1.753 m)   Wt 135 lb (61.2 kg)   Breastfeeding? No   BMI 19.94 kg/m  Body mass index is 19.94 kg/m. General appearance: Well nourished, well developed female in no acute distress.  Respiratory: Normal respiratory effort Abdomen: positive bowel sounds and no masses, hernias; diffusely non tender to palpation, non distended. Well healed LT incision  Laboratory: as above  PP  Depression Screening:  PHQ9 1. No SI/HI  Assessment: pt doing well  Plan:  Declines any contraception. Warned patient that since menses have started to use condoms and PRN Plan B if doesn't desire pregnancy. Pt told to let us know if she changes her mind on starting anything. Pt told to repeat pap in 2 years.   RTC PRN  Durene Romans MD Attending Center for [employer])

## 2016-07-16 NOTE — Progress Notes (Signed)
Subjective

## 2017-12-01 IMAGING — US US MFM OB LIMITED
1 series · 15 of 28 positions shown · non-contrast
Comparison: none

[Series 1: us mfm ob limited · 29 acquisitions, 15 frames shown]
[im 1/29]
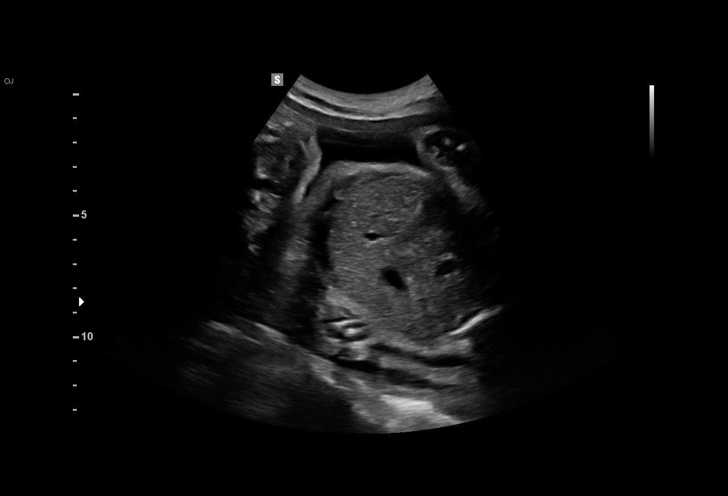
[im 3/29]
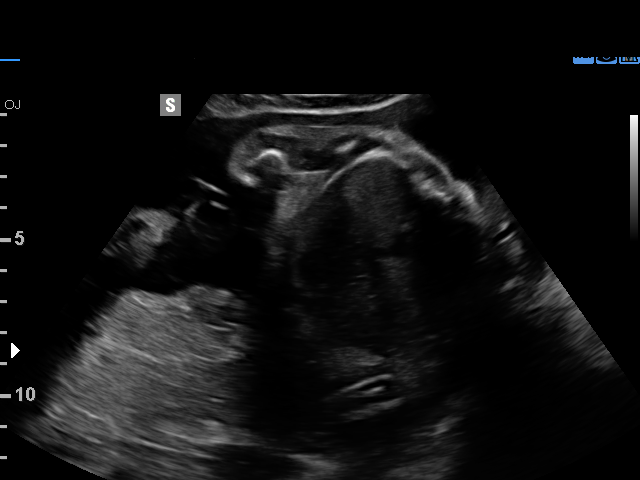
[im 5/29]
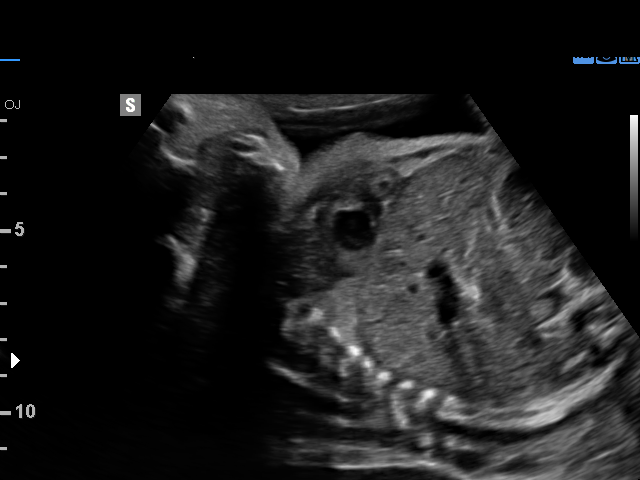
[im 7/29]
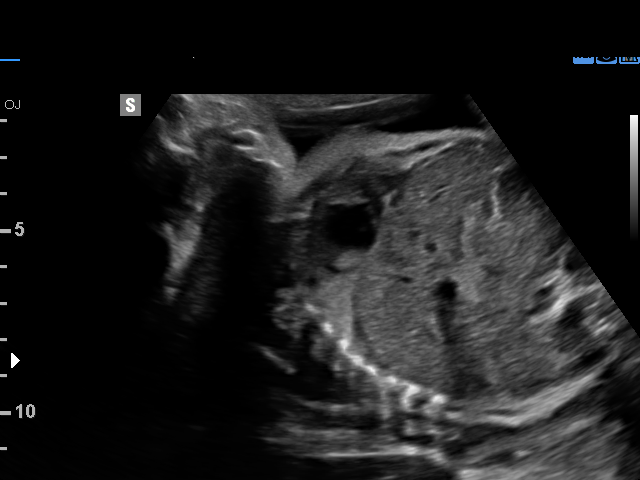
[im 9/29]
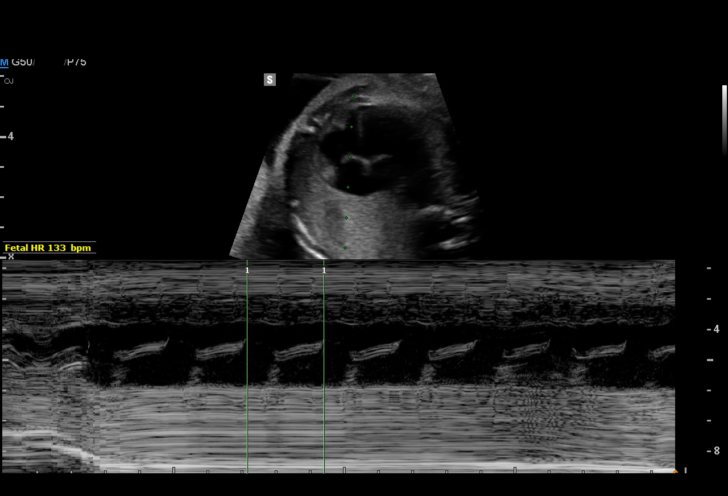
[im 11/29]
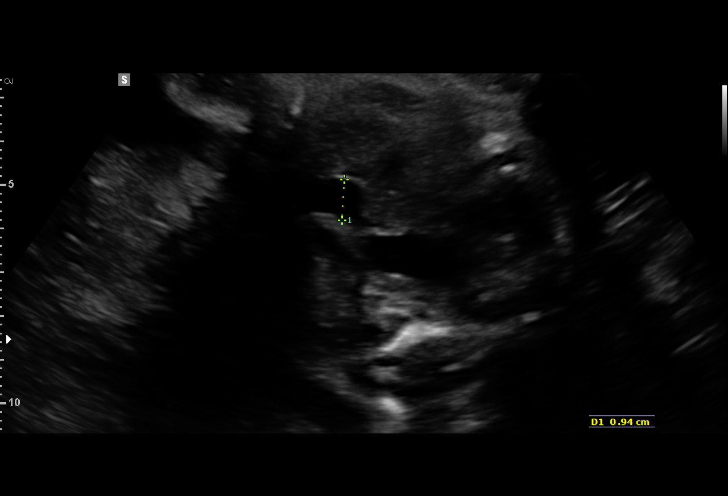
[im 13/29]
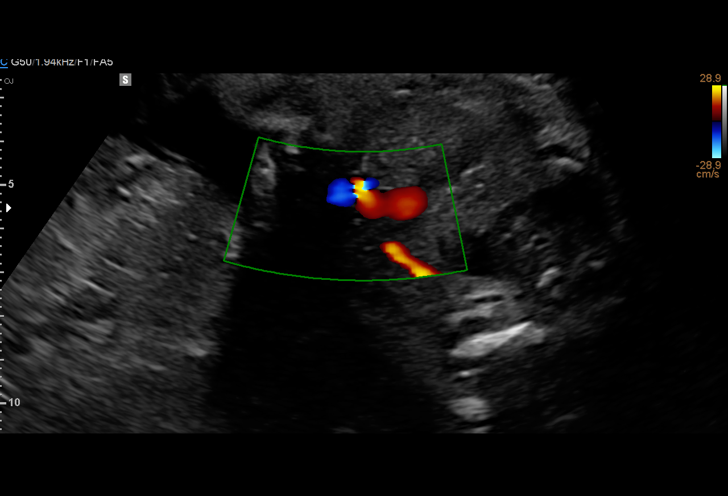
[im 15/29]
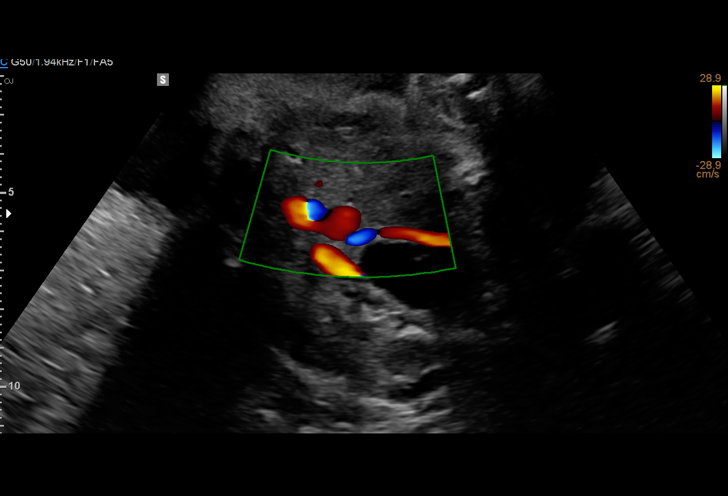
[im 16/29]
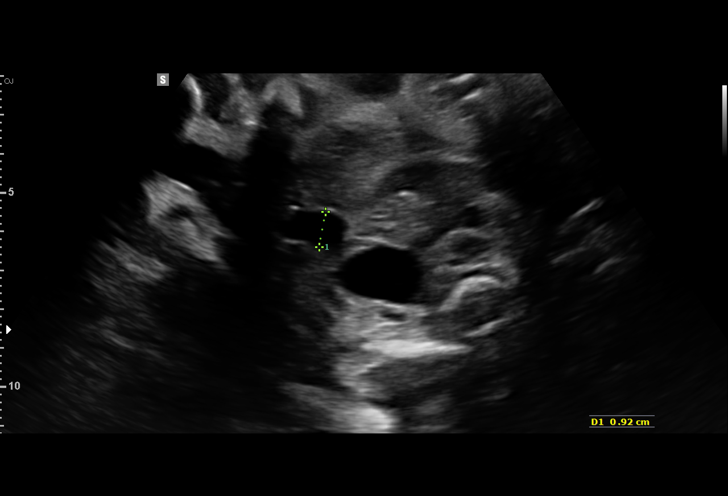
[im 18/29]
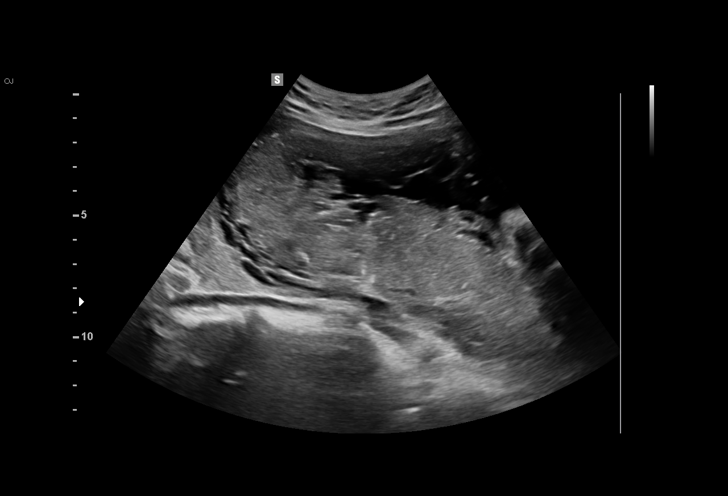
[im 20/29]
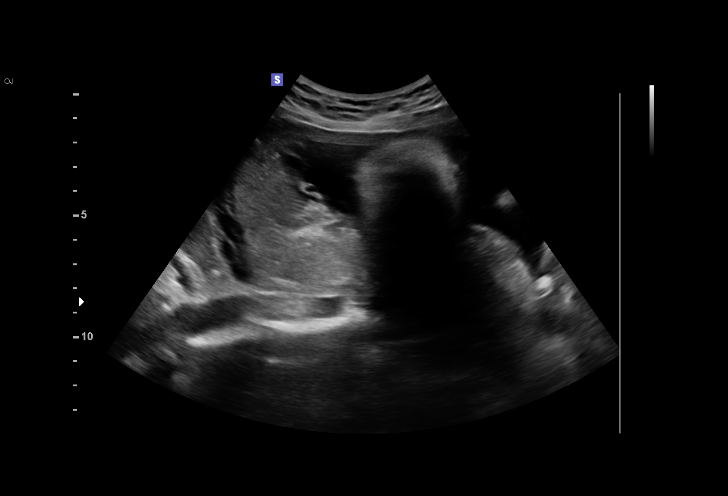
[im 22/29]
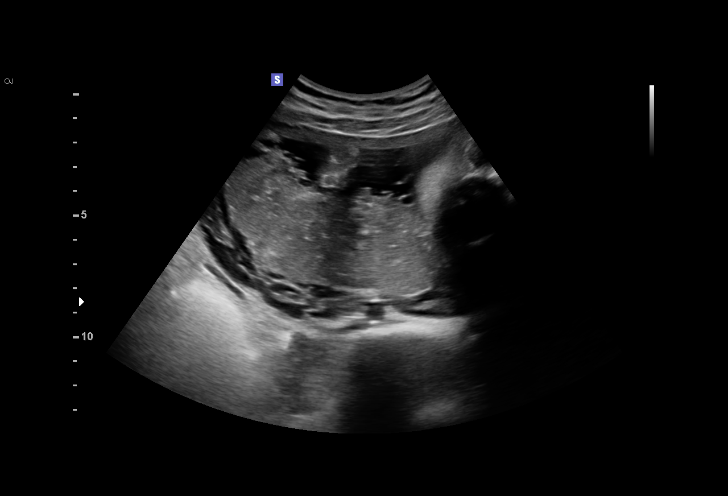
[im 24/29]
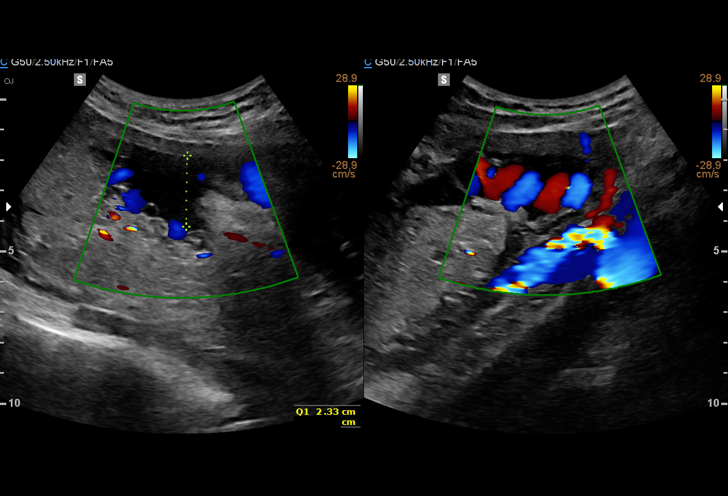
[im 26/29]
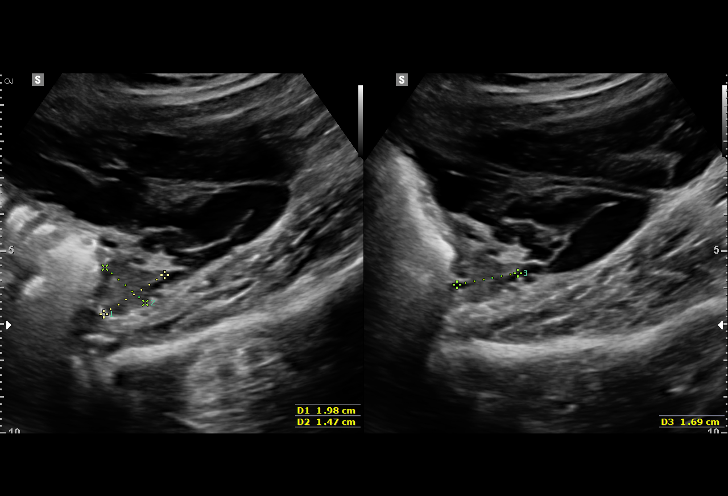
[im 29/29]
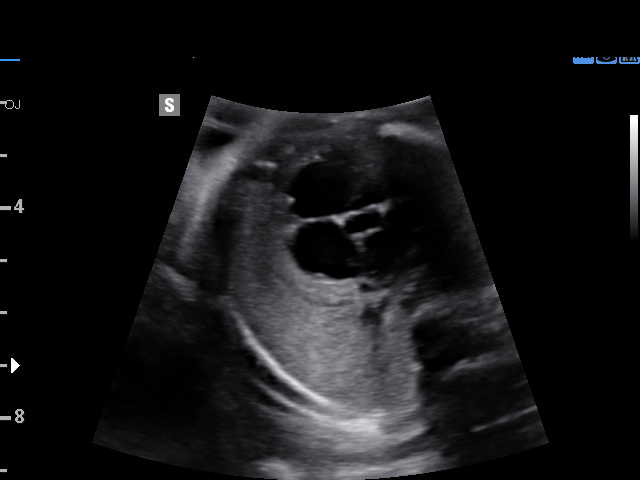

[15 of 28 positions shown; findings below may reference images not displayed]

OB/Gyn Clinic

1  MUKUNGU TEMBA            [id number]      [id number]     [id number]
2  MUKUNGU TEMBA            [id number]      [id number]     [id number]
Indications

29 weeks gestation of pregnancy
Late to prenatal care, third trimester
Tobacco use complicating pregnancy, third
trimester
Umbilical vein abnormality complicating
pregnancy; UVV
OB History

Gravidity:    3         Term:   1
TOP:          1        Living:  1
Fetal Evaluation

Num Of Fetuses:     1
Fetal Heart         133
Rate(bpm):
Cardiac Activity:   Observed
Presentation:       Breech
Placenta:           Posterior, above cervical os

Amniotic Fluid
AFI FV:      Subjectively within normal limits

AFI Sum(cm)     %Tile       Largest Pocket(cm)
9.26            6
RUQ(cm)       RLQ(cm)       LUQ(cm)
2.33
Biophysical Evaluation

Amniotic F.V:   Within normal limits       F. Tone:        Observed
F. Movement:    Observed                   Score:          [DATE]
F. Breathing:   Observed
Gestational Age

LMP:           29w 1d       Date:   09/19/15                 EDD:   06/25/16
Best:          29w 1d    Det. By:   LMP  (09/19/15)          EDD:   06/25/16
Anatomy

Abdomen:               Umbilical vein
varix
Impression

SIUP at 29+1 weeks
UVV - no change in size; no filling defect
Normal amniotic fluid volume
BPP [DATE]
Recommendations

Continue twice weekly fetal assessments

## 2017-12-11 IMAGING — US US MFM OB LIMITED
1 series · 12 of 28 positions shown · non-contrast
Comparison: none

[Series 1: us mfm ob limited · 39 acquisitions, 12 frames shown]
[im 2/39]
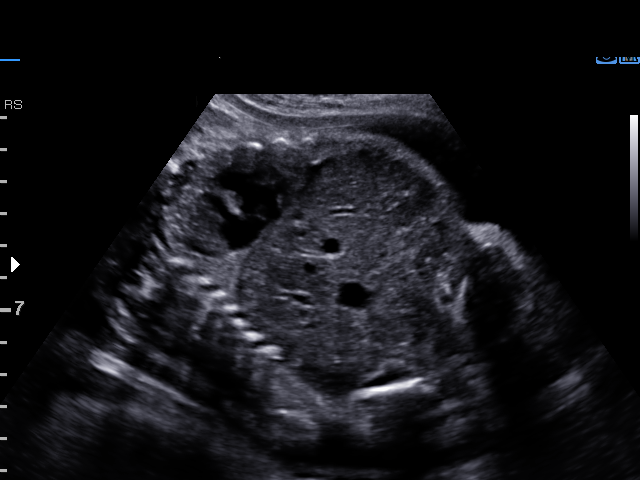
[im 5/39]
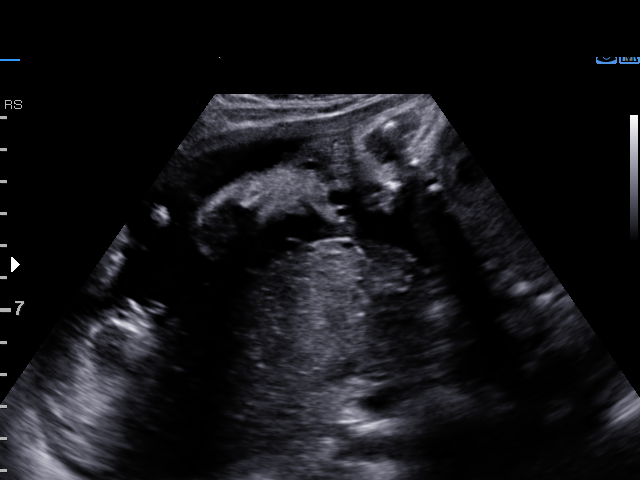
[im 8/39]
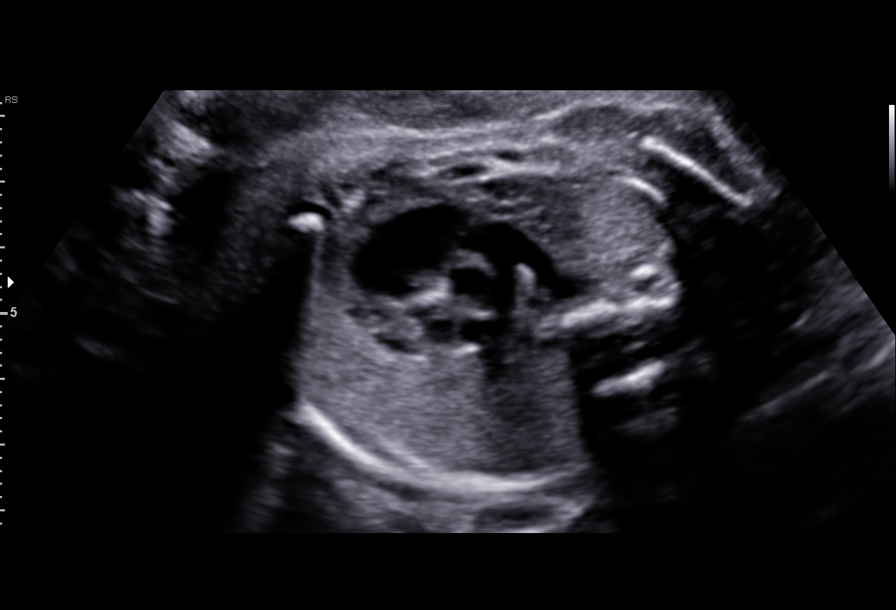
[im 12/39]
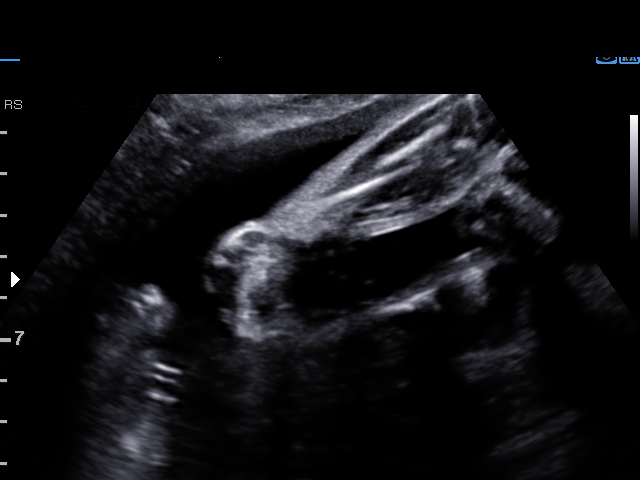
[im 15/39]
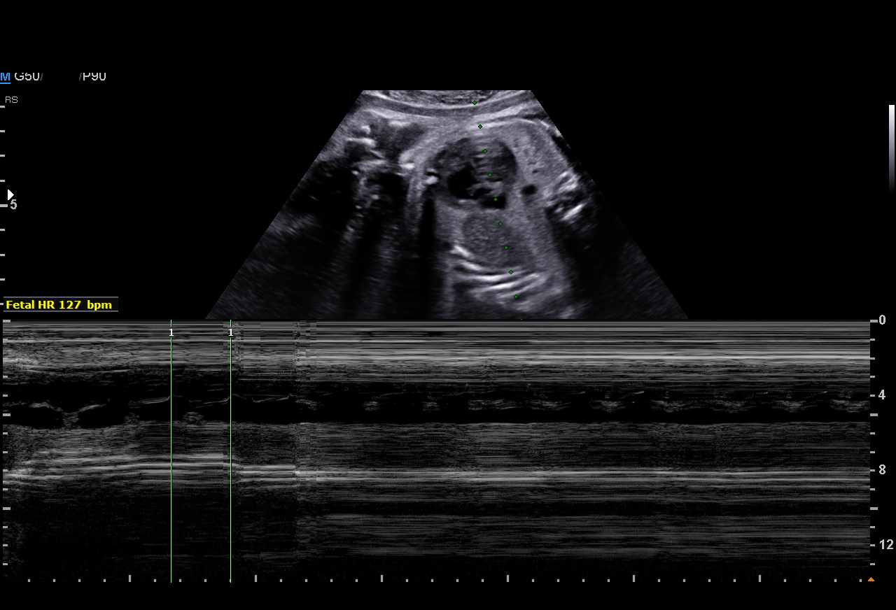
[im 17/39]
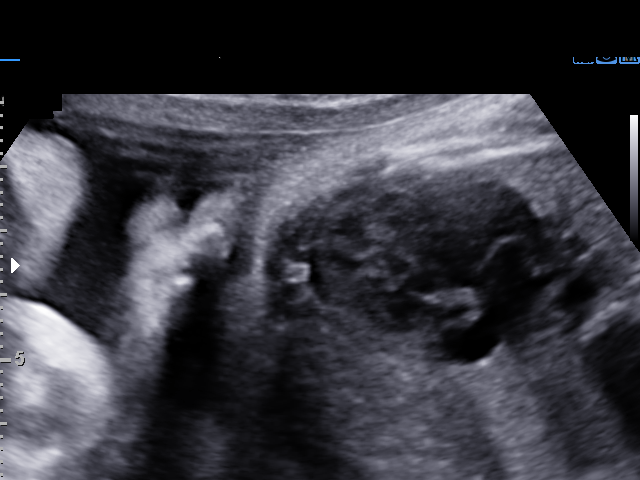
[im 22/39]
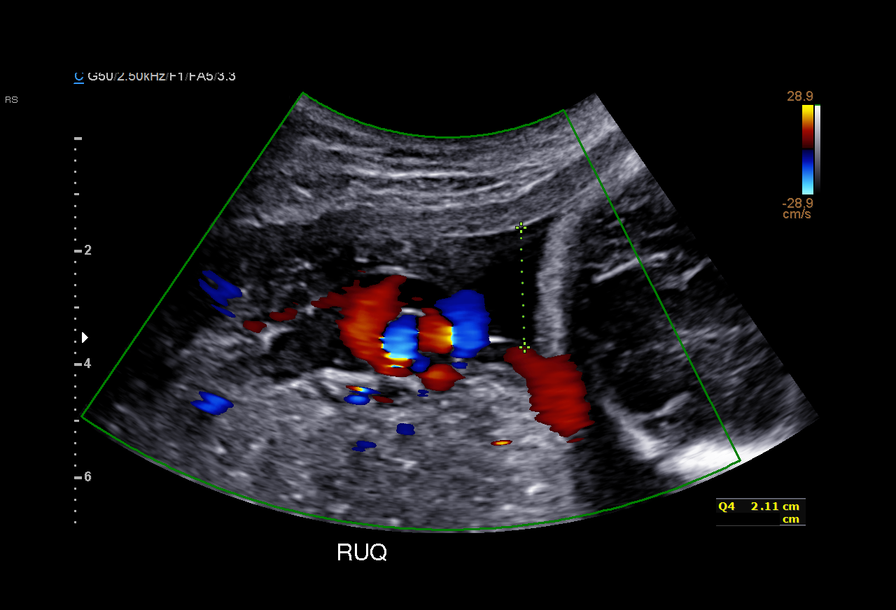
[im 24/39]
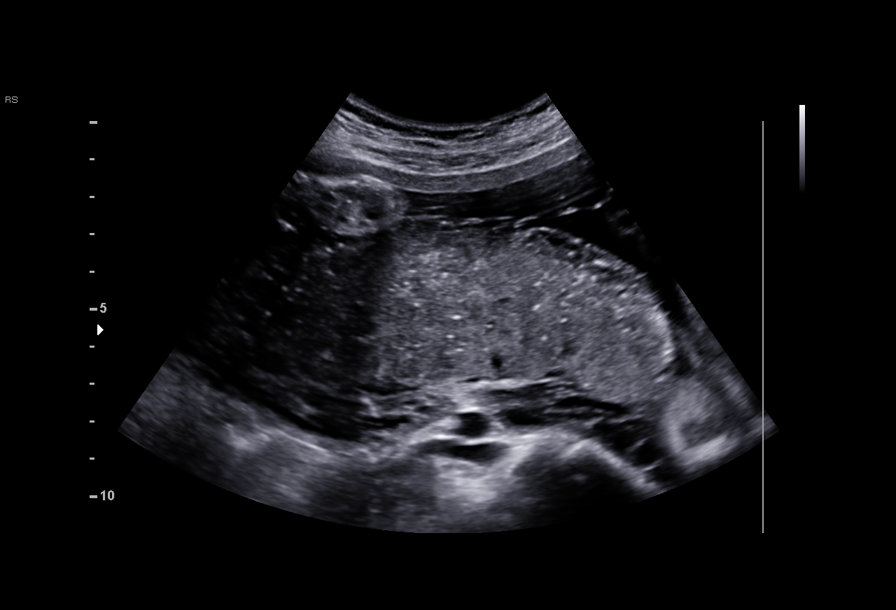
[im 27/39]
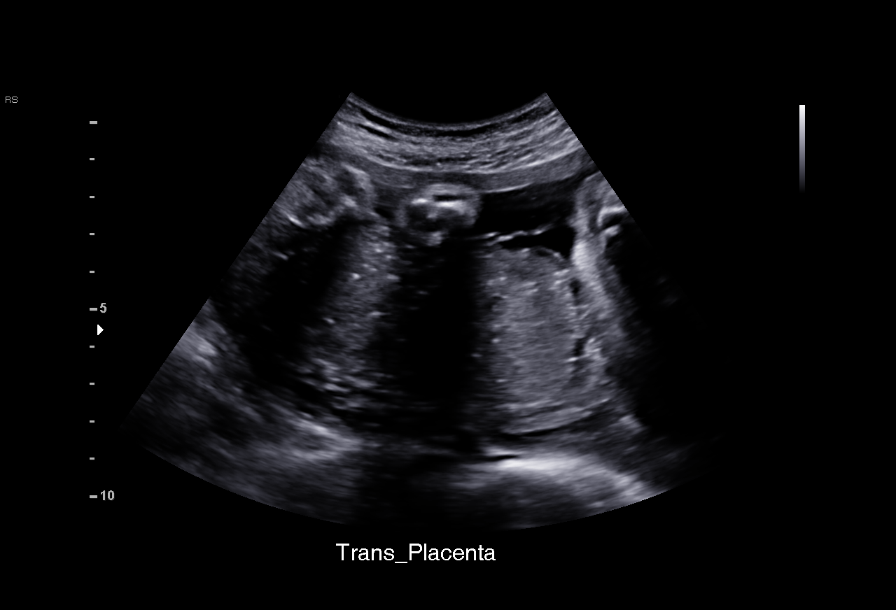
[im 31/39]
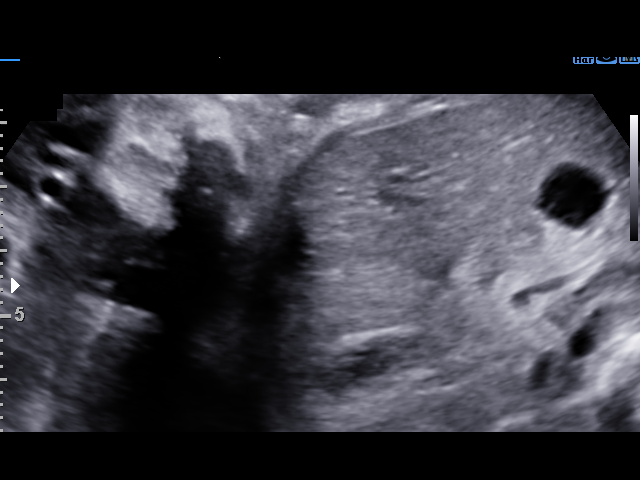
[im 34/39]
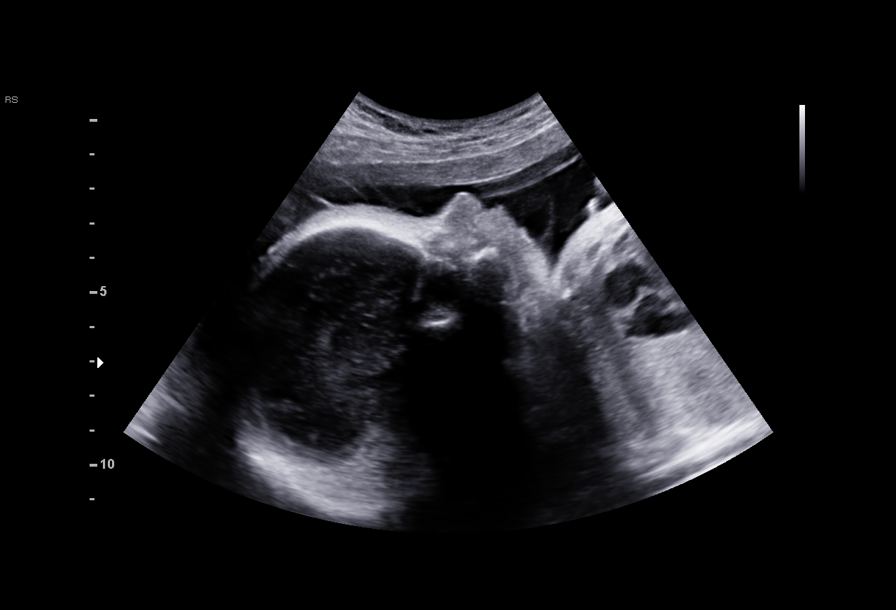
[im 37/39]
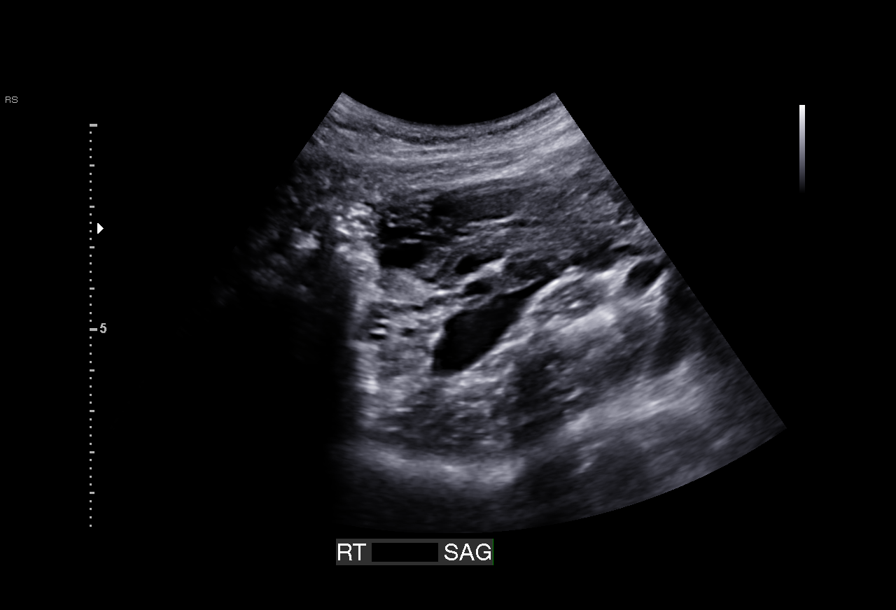

[12 of 28 positions shown; findings below may reference images not displayed]

OB/Gyn Clinic

1  SANTOS GALBRAITH            [id number]      [id number]     [id number]
2  SANTOS GALBRAITH            [id number]      [id number]     [id number]
Indications

30 weeks gestation of pregnancy
Late to prenatal care, third trimester
Tobacco use complicating pregnancy, third
trimester
Umbilical vein abnormality complicating
pregnancy; UVV
OB History

Gravidity:    3         Term:   1
TOP:          1        Living:  1
Fetal Evaluation

Num Of Fetuses:     1
Fetal Heart         127
Rate(bpm):
Cardiac Activity:   Observed
Presentation:       Breech
Placenta:           Posterior, above cervical os
P. Cord Insertion:  Visualized

Amniotic Fluid
AFI FV:      Subjectively within normal limits
AFI Sum(cm)     %Tile       Largest Pocket(cm)
11.81           29

RUQ(cm)       RLQ(cm)       LUQ(cm)        LLQ(cm)
4.36
Biophysical Evaluation

Amniotic F.V:   Pocket => 2 cm two         F. Tone:        Observed
planes
F. Movement:    Observed                   Score:          [DATE]
F. Breathing:   Observed
Gestational Age

LMP:           30w 4d       Date:   09/19/15                 EDD:   06/25/16
Best:          30w 4d    Det. By:   LMP  (09/19/15)          EDD:   06/25/16
Anatomy

Stomach:               Appears normal, left   Bladder:                Appears normal
sided
Cervix Uterus Adnexa

Cervix
Not visualized (advanced GA >58wks)

Uterus
No abnormality visualized.

Left Ovary
Within normal limits.

Right Ovary
Not visualized.

Adnexa:       No abnormality visualized. No adnexal mass
visualized.
Impression

Single IUP at 30w 4d
Follow up due to umbilical vein varix
The UVV measures 1.02 cm.  No filling defects noted on
color Doppler studies
BPP [DATE]
Normal amniotic fluid volume
Recommendations

Continue twice weekly BPPs with color flow evaluation of UVV
Serial ultrasounds for growth
Delivery at 37 weeks gestation

## 2018-01-05 IMAGING — US US MFM FETAL BPP W/O NON-STRESS
1 series · 13 of 17 positions shown · non-contrast
Comparison: none

[Series 1: us mfm fetal bpp w/o non-stress · 17 acquisitions, 13 frames shown]
[im 1/17]
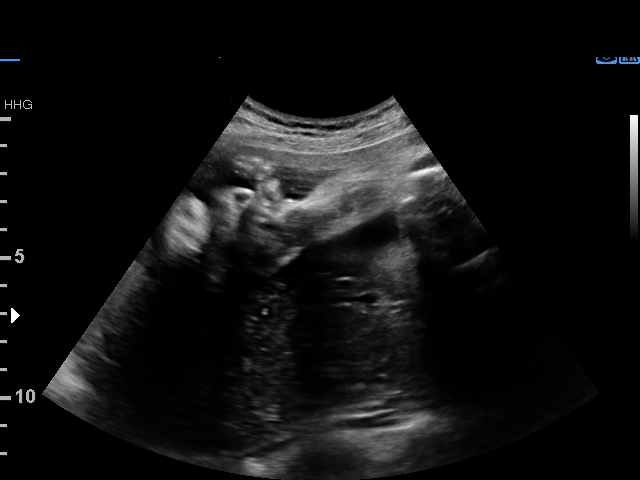
[im 2/17]
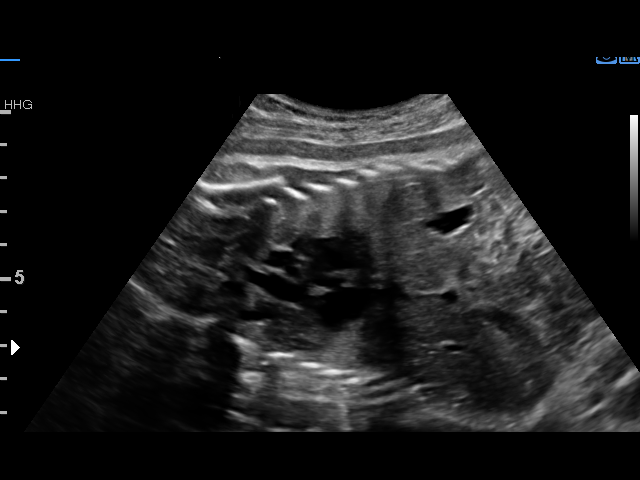
[im 4/17]
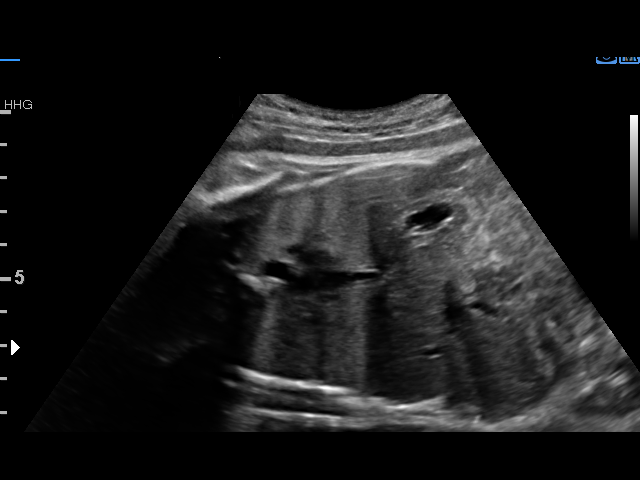
[im 5/17]
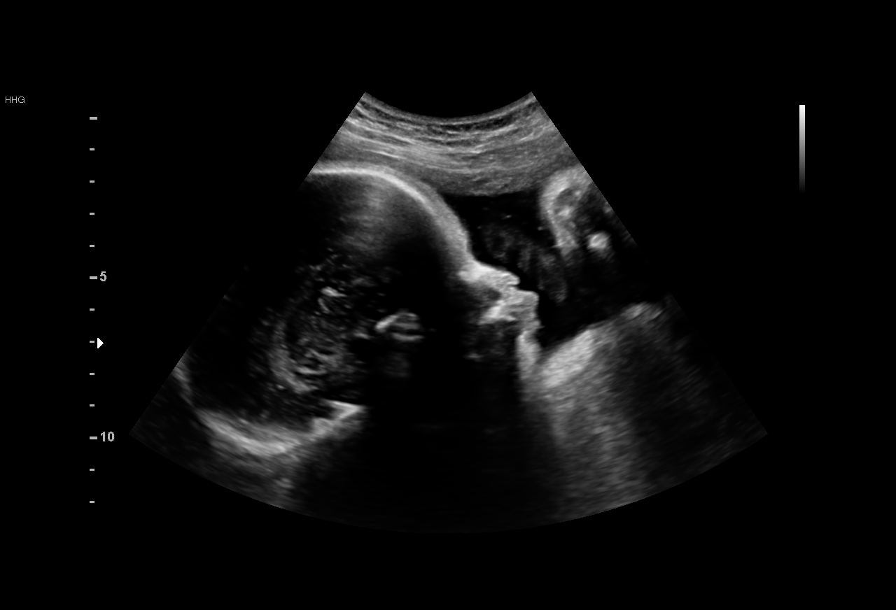
[im 6/17]
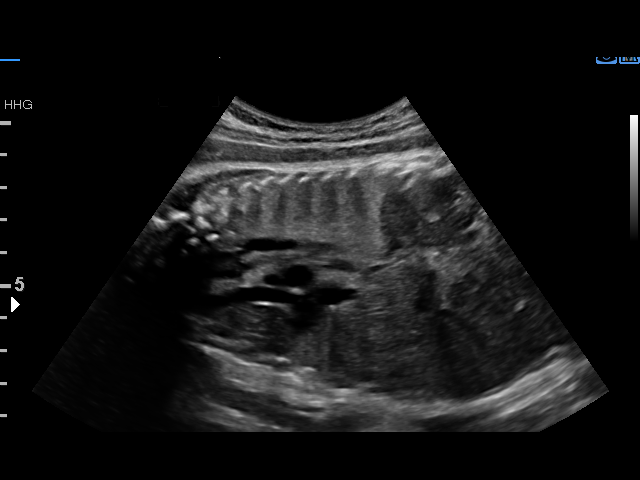
[im 8/17]
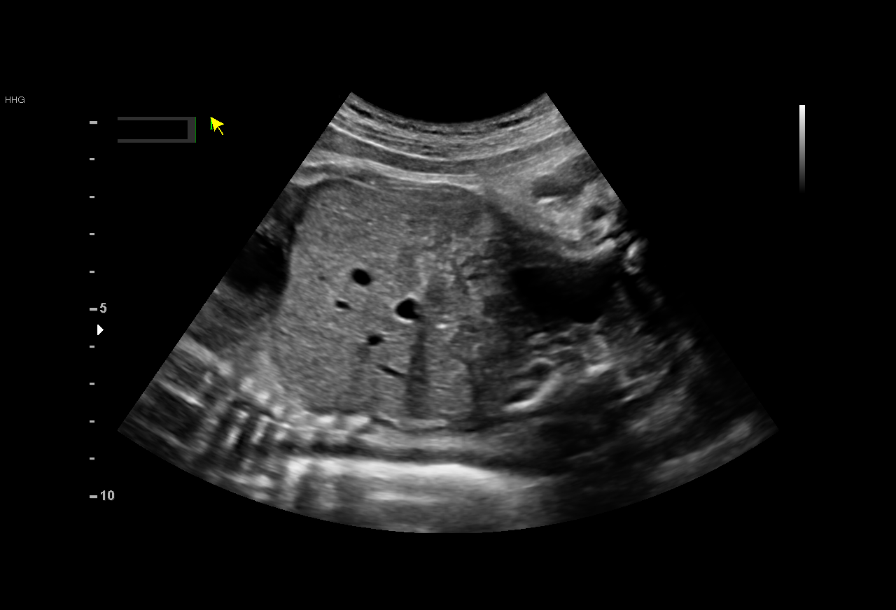
[im 9/17]
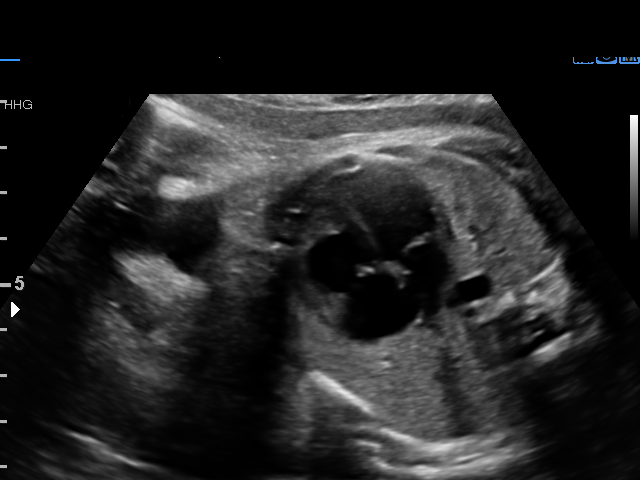
[im 10/17]
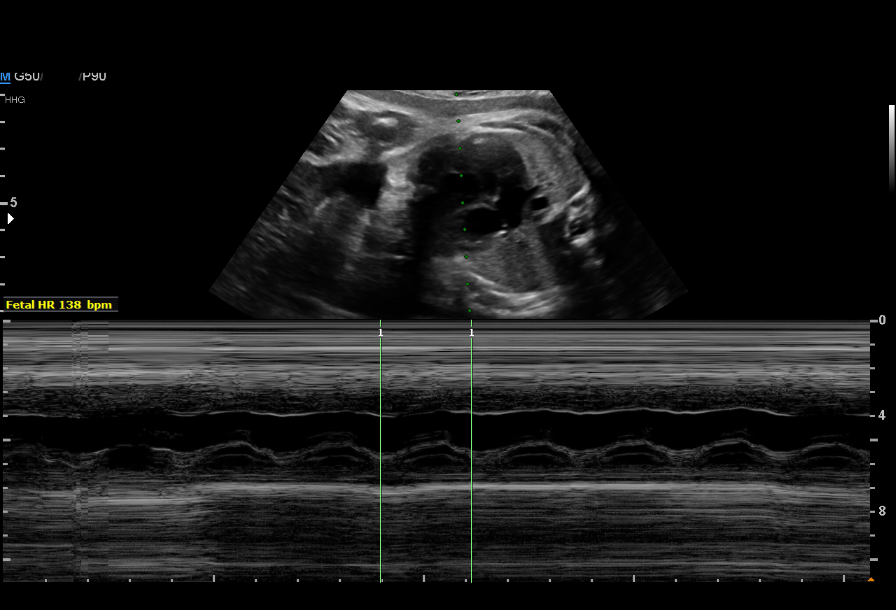
[im 12/17]
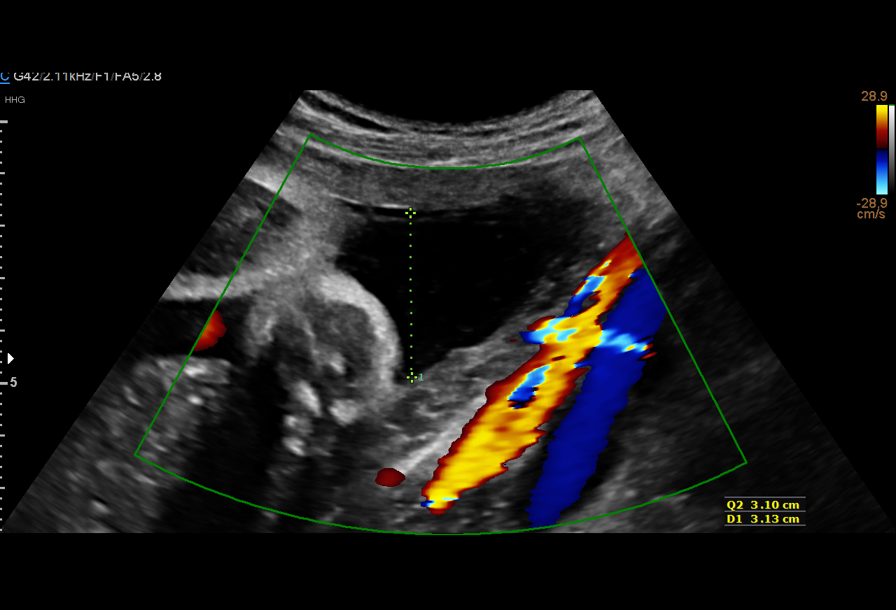
[im 13/17]
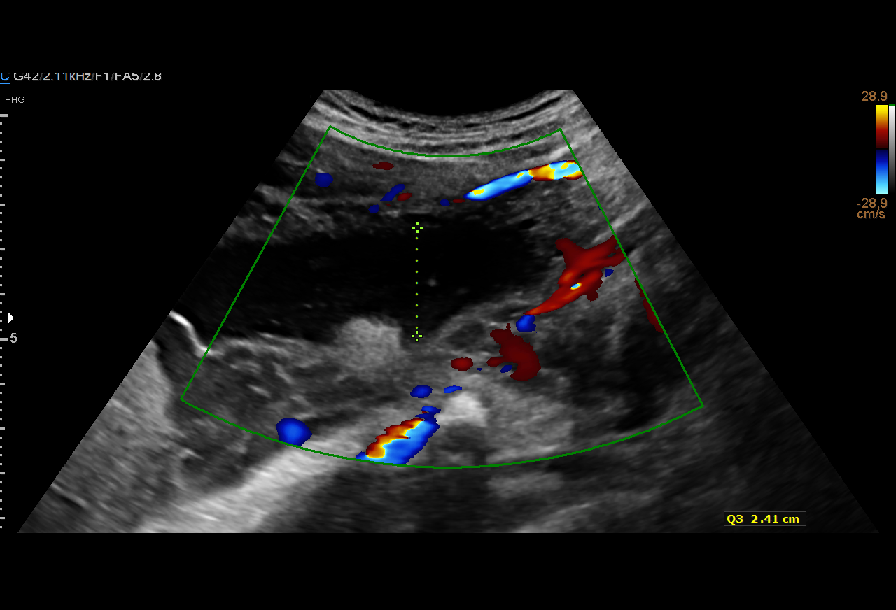
[im 14/17]
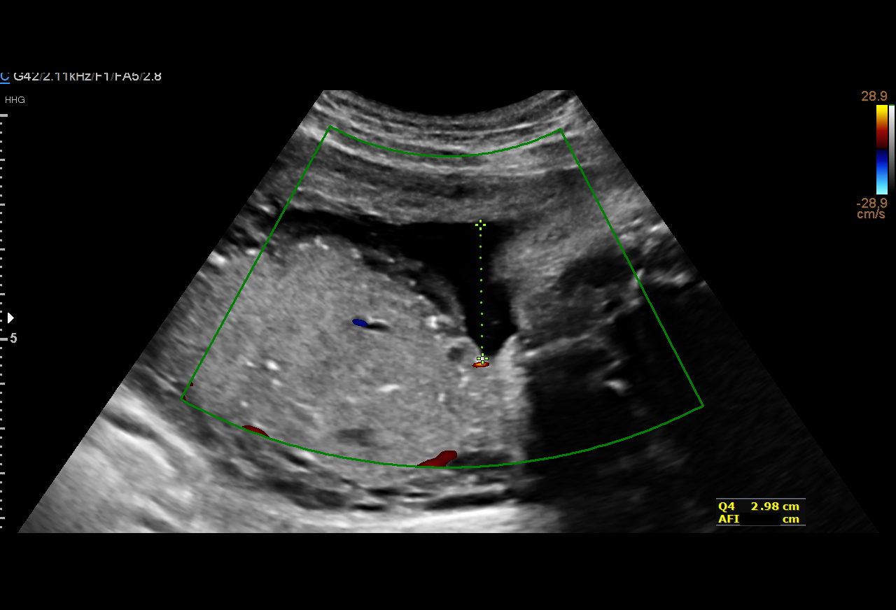
[im 16/17]
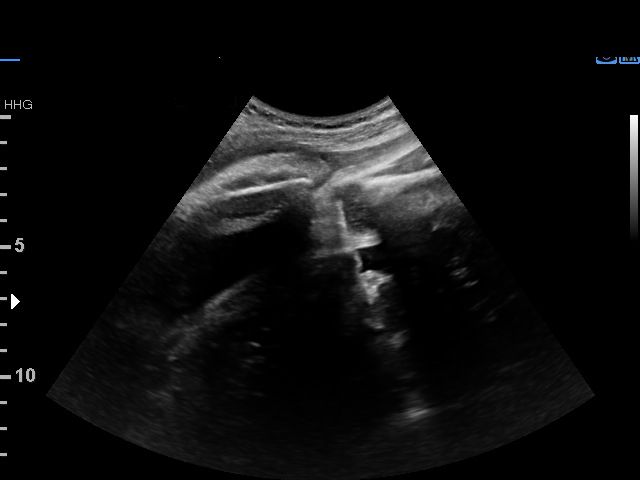
[im 17/17]
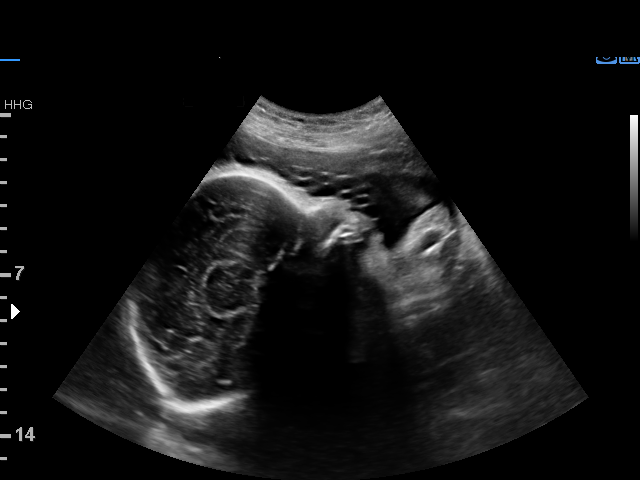

[13 of 17 positions shown; findings below may reference images not displayed]

OB/Gyn Clinic

1  KAPITONAS ANANKO            [id number]      [id number]     [id number]
Indications

34 weeks gestation of pregnancy
Late to prenatal care, third trimester
Tobacco use complicating pregnancy, third
trimester
Umbilical vein abnormality complicating
pregnancy; UVV
OB History

Gravidity:    3         Term:   1
TOP:          1        Living:  1
Fetal Evaluation

Num Of Fetuses:     1
Fetal Heart         138
Rate(bpm):
Cardiac Activity:   Observed
Presentation:       Breech

Amniotic Fluid
AFI FV:      Subjectively within normal limits

AFI Sum(cm)     %Tile       Largest Pocket(cm)
11.63           31

RUQ(cm)       RLQ(cm)       LUQ(cm)        LLQ(cm)
3.14
Comment:    BPP [DATE]
Biophysical Evaluation

Amniotic F.V:   Within normal limits       F. Tone:        Observed
F. Movement:    Observed                   Score:          [DATE]
F. Breathing:   Observed
Gestational Age

LMP:           34w 1d        Date:  09/19/15                 EDD:   06/25/16
Best:          34w 1d     Det. By:  LMP  (09/19/15)          EDD:   06/25/16
Impression

Singleton intrauterine pregnancy 34 weeks 1 day gestation
with fetal cardiac activity
Breech presentation
BPP [DATE] with an AFI > 11 cm
Recommendations

Continue 2x weekly BPPs and color flow evalaution of the
UVV
Serial ultrasounds for growth
Delivery by 37 weeks gestation

## 2021-07-06 ENCOUNTER — Telehealth: Payer: Self-pay | Admitting: Hematology and Oncology

## 2021-07-06 NOTE — Telephone Encounter (Signed)
Scheduled appt per 118 referral. Pt's mother is aware of appt date and time.

## 2021-07-08 NOTE — Progress Notes (Incomplete)
[place] CONSULT NOTE  No care team member to display  CHIEF COMPLAINTS/PURPOSE OF CONSULTATION:  Newly diagnosed left breast cancer  HISTORY OF PRESENTING ILLNESS:  Regina Barnett 30 y.o. female is here because of recent diagnosis of invasive ductal carcinoma of the left breast. Diagnostic mammogram and [place] on 02/11/2020 showed 3 cm spiculated mass at the site of palpable abnormality in the left breast with pleomorphic calcifications associated extending across multiple quadrants of the breast measuring a total of 9.1 x 11 x 7.4 cm. Biopsy on 02/26/2020 showed invasive ductal carcinoma ER+/PR+/Her2-. MRI Breast on 03/13/2020 showed extensive enhancement in the upper outer quadrant extending to the upper inner quadrant spanning 6.4 x 4.7 cm in the left breast. She started neoadjuvant antiestrogen therapy in August 2021. She presents to the clinic today for initial evaluation and discussion of treatment options.   I reviewed her records extensively and collaborated the history with the patient.  SUMMARY OF ONCOLOGIC HISTORY: Oncology History   No history exists.    MEDICAL HISTORY:  Past Medical History:  Diagnosis Date   Medical history non-contributory     SURGICAL HISTORY: Past Surgical History:  Procedure Laterality Date   CESAREAN SECTION N/A 06/04/2016   Procedure: CESAREAN SECTION;  Surgeon: Chancy Milroy, MD;  Location: [place];  Service: Obstetrics;  Laterality: N/A;   NO PAST SURGERIES      SOCIAL HISTORY: Social History   Socioeconomic History   Marital status: Unknown    Spouse name: Not on file   Number of children: Not on file   Years of education: Not on file   Highest education level: Not on file  Occupational History   Not on file  Tobacco Use   Smoking status: Every Day    Packs/day: 0.50    Years: 7.00    Pack years: 3.50    Types: Cigarettes   Smokeless tobacco: Never  Substance and Sexual Activity   Alcohol use: No    Drug use: No   Sexual activity: Yes    Birth control/protection: None  Other Topics Concern   Not on file  Social History Narrative   Not on file   Social Determinants of Health   Financial Resource Strain: Not on file  Food Insecurity: Not on file  Transportation Needs: Not on file  Physical Activity: Not on file  Stress: Not on file  Social Connections: Not on file  Intimate Partner Violence: Not on file    FAMILY HISTORY: Family History  Problem Relation Age of Onset   Cancer Mother 18       breast    ALLERGIES:  has No Known Allergies.  MEDICATIONS:  Current Outpatient Medications  Medication Sig Dispense Refill   acetaminophen (TYLENOL) 325 MG tablet Take 650 mg by mouth every 6 (six) hours as needed for mild pain, moderate pain or headache.     ibuprofen (ADVIL,MOTRIN) 600 MG tablet Take 1 tablet (600 mg total) by mouth every 6 (six) hours. (Patient not taking: Reported on 07/16/2016) 30 tablet 0   No current facility-administered medications for this visit.    REVIEW OF SYSTEMS:   Constitutional: Denies fevers, chills or abnormal night sweats Eyes: Denies blurriness of vision, double vision or watery eyes Ears, nose, mouth, throat, and face: Denies mucositis or sore throat Respiratory: Denies cough, dyspnea or wheezes Cardiovascular: Denies palpitation, chest discomfort or lower extremity swelling Gastrointestinal:  Denies nausea, heartburn or change in bowel habits Skin: Denies abnormal skin  rashes Lymphatics: Denies new lymphadenopathy or easy bruising Neurological:Denies numbness, tingling or new weaknesses Behavioral/Psych: Mood is stable, no new changes  Breast: *** Denies any palpable lumps or discharge All other systems were reviewed with the patient and are negative.  PHYSICAL EXAMINATION: ECOG PERFORMANCE STATUS: {CHL ONC ECOG [id number]}  There were no vitals filed for this visit. There were no vitals filed for this  visit.  GENERAL:alert, no distress and comfortable SKIN: skin color, texture, turgor are normal, no rashes or significant lesions EYES: normal, conjunctiva are pink and non-injected, sclera clear OROPHARYNX:no exudate, no erythema and lips, buccal mucosa, and tongue normal  NECK: supple, thyroid normal size, non-tender, without nodularity LYMPH:  no palpable lymphadenopathy in the cervical, axillary or inguinal LUNGS: clear to auscultation and percussion with normal breathing effort HEART: regular rate & rhythm and no murmurs and no lower extremity edema ABDOMEN:abdomen soft, non-tender and normal bowel sounds Musculoskeletal:no cyanosis of digits and no clubbing  PSYCH: alert & oriented x 3 with fluent speech NEURO: no focal motor/sensory deficits BREAST:*** No palpable nodules in breast. No palpable axillary or supraclavicular lymphadenopathy (exam performed in the presence of a chaperone)   LABORATORY DATA:  I have reviewed the data as listed Lab Results  Component Value Date   WBC 14.5 (H) 06/05/2016   HGB 10.7 (L) 06/05/2016   HCT 30.2 (L) 06/05/2016   MCV 94.4 06/05/2016   PLT 247 06/05/2016   No results found for: NA, K, CL, CO2  RADIOGRAPHIC STUDIES: I have personally reviewed the radiological reports and agreed with the findings in the report.  ASSESSMENT AND PLAN:  No problem-specific Assessment & Plan notes found for this encounter.   All questions were answered. The patient knows to call the clinic with any problems, questions or concerns.   Rulon Eisenmenger, MD, MPH 07/08/2021    I, Thana Ates, am acting as scribe for Nicholas Lose, MD.  {Add scribe attestation statement}

## 2021-07-10 ENCOUNTER — Telehealth: Payer: Self-pay | Admitting: Hematology and Oncology

## 2021-07-10 ENCOUNTER — Inpatient Hospital Stay: Payer: Medicaid Other | Admitting: Hematology and Oncology

## 2021-07-10 DIAGNOSIS — C50212 Malignant neoplasm of upper-inner quadrant of left female breast: Secondary | ICD-10-CM | POA: Insufficient documentation

## 2021-07-10 DIAGNOSIS — Z170 Estrogen receptor positive status [ER+]: Secondary | ICD-10-CM | POA: Insufficient documentation

## 2021-07-10 NOTE — Telephone Encounter (Signed)
R/s pt's missed new patient appt with Dr. Lindi Adie. Spoke to pt's mother who said they were unable to get transportation to appt which is why it was missed. I r/s to next available day/time and pt's mother is aware.

## 2021-07-10 NOTE — Assessment & Plan Note (Deleted)
02/11/2020: Palpable mass left breast: Diagnostic mammogram and US  showed 3 cm spiculated mass with pleomorphic calcifications associated extending across multiple quadrants of the breast measuring a total of 9.1 x 11 x 7.4 cm. Biopsy on 02/26/2020 showed invasive ductal carcinoma ER+/PR+/Her2-. MRI Breast on 03/13/2020 showed extensive enhancement in the upper outer quadrant extending to the upper inner quadrant spanning 6.4 x 4.7 cm in the left breast. She started neoadjuvant antiestrogen therapy in August 2021

## 2021-08-09 NOTE — Progress Notes (Signed)
[place] CONSULT NOTE  Patient Care Team: Karoline Caldwell, DO as PCP - General (Family Medicine)  CHIEF COMPLAINTS/PURPOSE OF CONSULTATION:  History of left breast cancer  HISTORY OF PRESENTING ILLNESS:  Regina Barnett 30 y.o. female is here because of history of malignant neoplasm of upper-outer quadrant of left breast in female, estrogen receptor positive. She presents to the clinic today for initial evaluation and discussion of treatment options.  Patient was diagnosed with breast cancer in 2021.  She had extensive involvement of the breast with pleomorphic calcifications.  It was ER/PR positive and HER2 negative and she was placed on tamoxifen therapy.  Surgery has been postponed and delayed because of her multiple health issues related to cirrhosis of the liver.  At 1 point she was diagnosed with acute renal failure and thought that she would die and she was even put on hospice but which was revoked subsequently.  She had made a full recovery at [facility] and continued on tamoxifen.  She had multiple conversations about surgical options and mastectomy was felt to be necessary based on the extensive changes in the breast.  She had a breast ultrasound which showed improvement in the breast findings but not enough to enable a lumpectomy.  She would like to do reconstruction but I do not think that was on the table at [facility].  Because of these issues she wanted to meet with our surgical team to discuss her treatment options.  She is accompanied by her mother who was treated here by Dr. Jana Hakim and is doing very well.  I reviewed her records extensively and collaborated the history with the patient.  SUMMARY OF ONCOLOGIC HISTORY: Oncology History  Malignant neoplasm of upper-inner quadrant of left breast in female, estrogen receptor positive ([place])  02/11/2020 Initial Diagnosis   Diagnostic mammogram and [place] on 02/11/2020 showed 3 cm spiculated mass at the site of palpable  abnormality in the left breast with pleomorphic calcifications associated extending across multiple quadrants of the breast measuring a total of 9.1 x 11 x 7.4 cm. Biopsy on 02/26/2020 showed invasive ductal carcinoma ER+/PR+/Her2-. MRI Breast on 03/13/2020 showed extensive enhancement in the upper outer quadrant extending to the upper inner quadrant spanning 6.4 x 4.7 cm in the left breast.    03/2020 -  Anti-estrogen oral therapy   Neoadjuvant antiestrogen therapy       MEDICAL HISTORY:  Past Medical History:  Diagnosis Date   Medical history non-contributory     SURGICAL HISTORY: Past Surgical History:  Procedure Laterality Date   CESAREAN SECTION N/A 06/04/2016   Procedure: CESAREAN SECTION;  Surgeon: Chancy Milroy, MD;  Location: [place];  Service: Obstetrics;  Laterality: N/A;   NO PAST SURGERIES      SOCIAL HISTORY: Social History   Socioeconomic History   Marital status: Unknown    Spouse name: Not on file   Number of children: Not on file   Years of education: Not on file   Highest education level: Not on file  Occupational History   Not on file  Tobacco Use   Smoking status: Every Day    Packs/day: 0.50    Years: 7.00    Pack years: 3.50    Types: Cigarettes   Smokeless tobacco: Never  Substance and Sexual Activity   Alcohol use: No   Drug use: No   Sexual activity: Yes    Birth control/protection: None  Other Topics Concern   Not on file  Social History  Narrative   Not on file   Social Determinants of Health   Financial Resource Strain: Not on file  Food Insecurity: Not on file  Transportation Needs: Not on file  Physical Activity: Not on file  Stress: Not on file  Social Connections: Not on file  Intimate Partner Violence: Not on file    FAMILY HISTORY: Family History  Problem Relation Age of Onset   Cancer Mother 32       breast    ALLERGIES:  has No Known Allergies.  MEDICATIONS:  Current Outpatient Medications   Medication Sig Dispense Refill   acetaminophen (TYLENOL) 325 MG tablet Take 650 mg by mouth every 6 (six) hours as needed for mild pain, moderate pain or headache.     ibuprofen (ADVIL,MOTRIN) 600 MG tablet Take 1 tablet (600 mg total) by mouth every 6 (six) hours. (Patient not taking: Reported on 07/16/2016) 30 tablet 0   No current facility-administered medications for this visit.    REVIEW OF SYSTEMS:   Constitutional: Denies fevers, chills or abnormal night sweats Eyes: Denies blurriness of vision, double vision or watery eyes Ears, nose, mouth, throat, and face: Denies mucositis or sore throat Respiratory: Denies cough, dyspnea or wheezes Cardiovascular: Denies palpitation, chest discomfort or lower extremity swelling Gastrointestinal:  Denies nausea, heartburn or change in bowel habits Skin: Denies abnormal skin rashes Lymphatics: Denies new lymphadenopathy or easy bruising Neurological:Denies numbness, tingling or new weaknesses Behavioral/Psych: Mood is stable, no new changes  Breast:  Denies any palpable lumps or discharge All other systems were reviewed with the patient and are negative.  PHYSICAL EXAMINATION: ECOG PERFORMANCE STATUS: 1 - Symptomatic but completely ambulatory  Vitals:   08/10/21 1538  BP: (!) 136/92  Pulse: 89  Resp: 18  Temp: 97.7 F (36.5 C)  SpO2: 100%   Filed Weights   08/10/21 1538  Weight: 183 lb 14.4 oz (83.4 kg)      LABORATORY DATA:  I have reviewed the data as listed Lab Results  Component Value Date   WBC 14.5 (H) 06/05/2016   HGB 10.7 (L) 06/05/2016   HCT 30.2 (L) 06/05/2016   MCV 94.4 06/05/2016   PLT 247 06/05/2016   No results found for: NA, K, CL, CO2  RADIOGRAPHIC STUDIES: I have personally reviewed the radiological reports and agreed with the findings in the report.  ASSESSMENT AND PLAN:  Malignant neoplasm of upper-inner quadrant of left breast in female, estrogen receptor positive ([place]) [facility] (Dr. Sarina Ser, Dr. 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  (However she is considered high risk and therefore surgery has been delayed) She was almost ready to undergo surgery when she was hospitalized for the variceal bleeding and surgery has been canceled. She would like to see our surgeons and plastic surgeons and radiation oncologist to discuss options.  Cirrhosis of the liver: She does have mild ascites and  lower extremity edema.  She appears to be slightly jaundiced as well as pale.  She is being followed by gastroenterology at [facility]. We will discuss this with our surgical team.  If she may be a candidate for surgery then I will arrange for appointments with surgery, plastic surgery and radiation. If the surgical team does not feel comfortable with surgery on her, then we may need to refer her to either [facility] or to [facility].  All questions were answered. The patient knows to call the clinic with any problems, questions or concerns.   Rulon Eisenmenger, MD, MPH 08/10/2021    I, Thana Ates, am acting as scribe for Nicholas Lose, MD.  I have reviewed the above documentation for accuracy and completeness, and I agree with the above.

## 2021-08-10 ENCOUNTER — Other Ambulatory Visit: Payer: Self-pay

## 2021-08-10 ENCOUNTER — Inpatient Hospital Stay: Payer: Medicaid Other | Attending: Hematology and Oncology | Admitting: Hematology and Oncology

## 2021-08-10 DIAGNOSIS — K7460 Unspecified cirrhosis of liver: Secondary | ICD-10-CM | POA: Insufficient documentation

## 2021-08-10 DIAGNOSIS — Z79810 Long term (current) use of selective estrogen receptor modulators (SERMs): Secondary | ICD-10-CM | POA: Diagnosis not present

## 2021-08-10 DIAGNOSIS — R188 Other ascites: Secondary | ICD-10-CM | POA: Insufficient documentation

## 2021-08-10 DIAGNOSIS — Z170 Estrogen receptor positive status [ER+]: Secondary | ICD-10-CM | POA: Insufficient documentation

## 2021-08-10 DIAGNOSIS — C50212 Malignant neoplasm of upper-inner quadrant of left female breast: Secondary | ICD-10-CM | POA: Diagnosis present

## 2021-08-10 DIAGNOSIS — R600 Localized edema: Secondary | ICD-10-CM | POA: Insufficient documentation

## 2021-08-10 NOTE — Assessment & Plan Note (Signed)
[facility] (Dr. Sarina Ser, Dr. 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  (However she is considered high risk and therefore surgery has been delayed)

## 2021-08-15 ENCOUNTER — Telehealth: Payer: Self-pay | Admitting: *Deleted

## 2021-08-15 NOTE — Telephone Encounter (Signed)
Called pt to provide navigation resources and contact information. No answer. Unable to leave vm as it is full. Will call again later

## 2021-08-24 ENCOUNTER — Telehealth: Payer: Self-pay | Admitting: *Deleted

## 2021-08-24 NOTE — Telephone Encounter (Signed)
Called pt again to provide navigation resources and contact information as well as to discuss tx plan. No answer, unable to leave vm on verified vm d/t mailbox full. Will try again.

## 2021-09-08 ENCOUNTER — Telehealth: Payer: Self-pay | Admitting: *Deleted

## 2021-09-08 ENCOUNTER — Encounter: Payer: Self-pay | Admitting: *Deleted

## 2021-09-08 NOTE — Telephone Encounter (Signed)
Attempted to call patient again. Left VM for a return phone call.

## 2021-09-08 NOTE — Telephone Encounter (Signed)
Spoke to pt mom Sherri. Informed pt would like to proceed with scheduling surgery.  Asked if pt would like to have reconstruction with mastectomy. Sherri feels that is [name]'s plan but will verify. Discussed with Sherri d/t pt hx of liver dz if she is going to have reconstruction we will need to refer her to [place], [place] or [place]. Received verbal understanding. Sherri will discuss with pt and call with back pt decision. Informed Sherri that if [name] does not want reconstruction, we can move forward with surgical consult and surgery scheduling.
